# Patient Record
Sex: Female | Born: 1950 | Race: White | Hispanic: No | Smoking: Never smoker
Health system: Southern US, Community
[De-identification: ages and names within clinical notes are randomized; demographics above are authoritative.]

## PROBLEM LIST (undated history)

## (undated) DIAGNOSIS — L309 Dermatitis, unspecified: Secondary | ICD-10-CM

## (undated) DIAGNOSIS — R011 Cardiac murmur, unspecified: Secondary | ICD-10-CM

## (undated) DIAGNOSIS — E785 Hyperlipidemia, unspecified: Secondary | ICD-10-CM

## (undated) DIAGNOSIS — M722 Plantar fascial fibromatosis: Secondary | ICD-10-CM

## (undated) DIAGNOSIS — I10 Essential (primary) hypertension: Secondary | ICD-10-CM

## (undated) HISTORY — PX: COLONOSCOPY: SHX174

## (undated) HISTORY — PX: ANAL FISSURECTOMY: SUR608

## (undated) HISTORY — PX: KNEE ARTHROSCOPY: SUR90

## (undated) HISTORY — PX: ANAL SPHINCTEROTOMY: SHX1140

---

## 2004-10-15 ENCOUNTER — Emergency Department: Payer: Self-pay | Admitting: Emergency Medicine

## 2004-12-12 ENCOUNTER — Ambulatory Visit: Payer: Self-pay | Admitting: Unknown Physician Specialty

## 2007-01-28 ENCOUNTER — Ambulatory Visit: Payer: Self-pay | Admitting: Unknown Physician Specialty

## 2007-05-30 ENCOUNTER — Ambulatory Visit: Payer: Self-pay | Admitting: Gastroenterology

## 2008-04-19 ENCOUNTER — Ambulatory Visit: Payer: Self-pay | Admitting: Unknown Physician Specialty

## 2009-05-29 ENCOUNTER — Ambulatory Visit: Payer: Self-pay | Admitting: Unknown Physician Specialty

## 2011-01-14 ENCOUNTER — Ambulatory Visit: Payer: Self-pay | Admitting: Unknown Physician Specialty

## 2012-02-03 IMAGING — MG MAM DGTL SCREENING MAMMO W/CAD
1 series · 7 of 7 positions shown · non-contrast
Comparison: none

REASON FOR EXAM: SCREENING
COMMENTS:  Submitted by practice: [facility] [HOSPITAL] Scheduled by
user: Menze

PROCEDURE:     MAM - MAM DGTL SCREENING MAMMO W/CAD  - January 14, 2011  [DATE]
RESULT:     Comparison is made to prior studies dated 01-28-07, 05-29-09, and
04-19-08.
The breasts are primarily fatty involuted. There is mild heterogeneity.
There is no radiographic evidence to suggest malignancy.

[R CC · right · 7 of 7 slices shown]
[im 1/7]
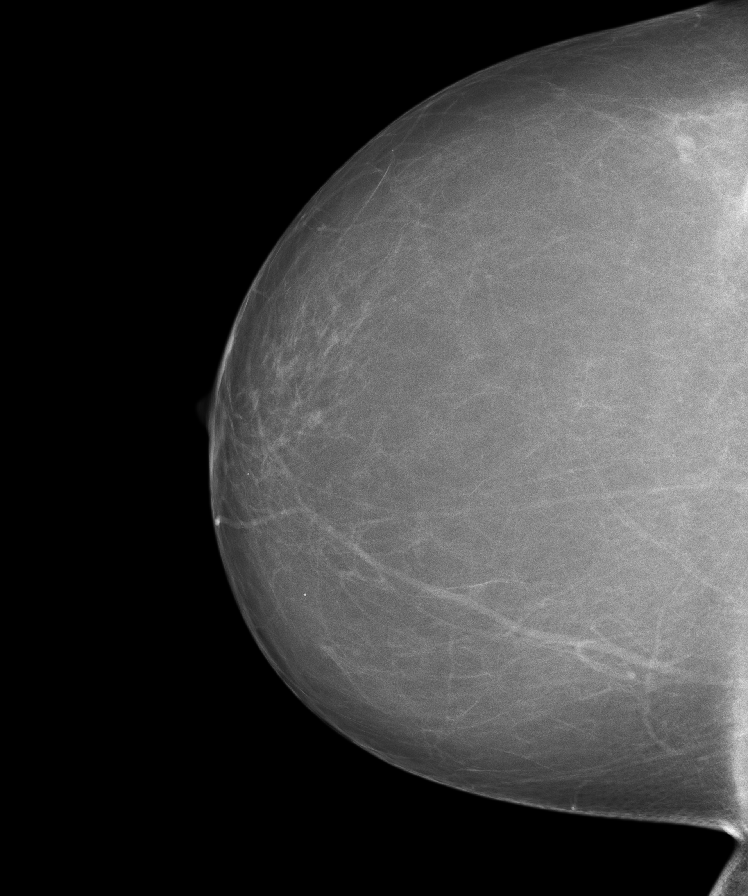
[im 2/7]
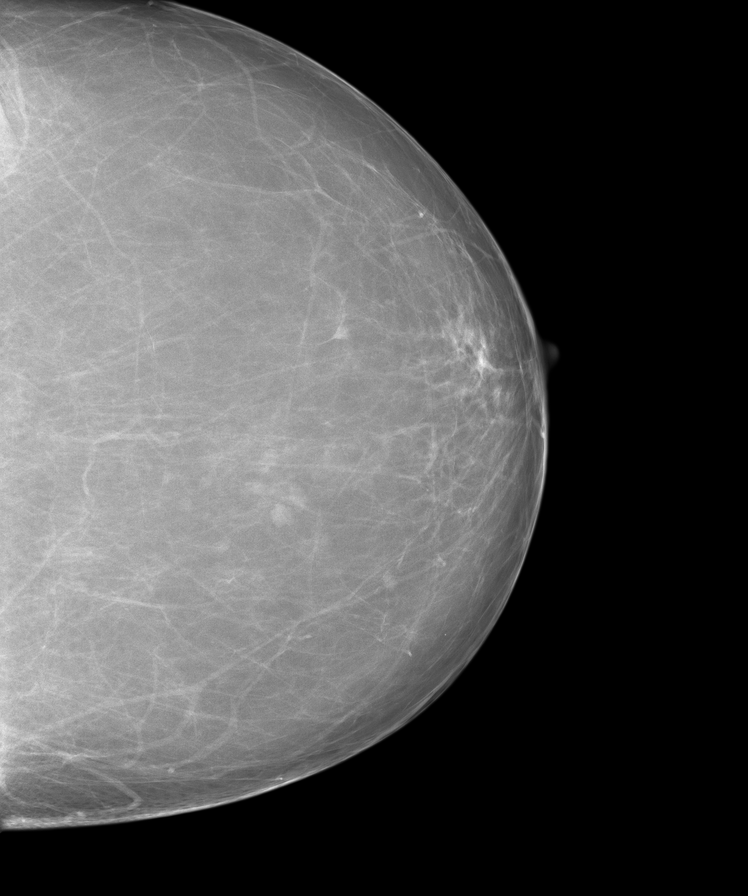
[im 3/7]
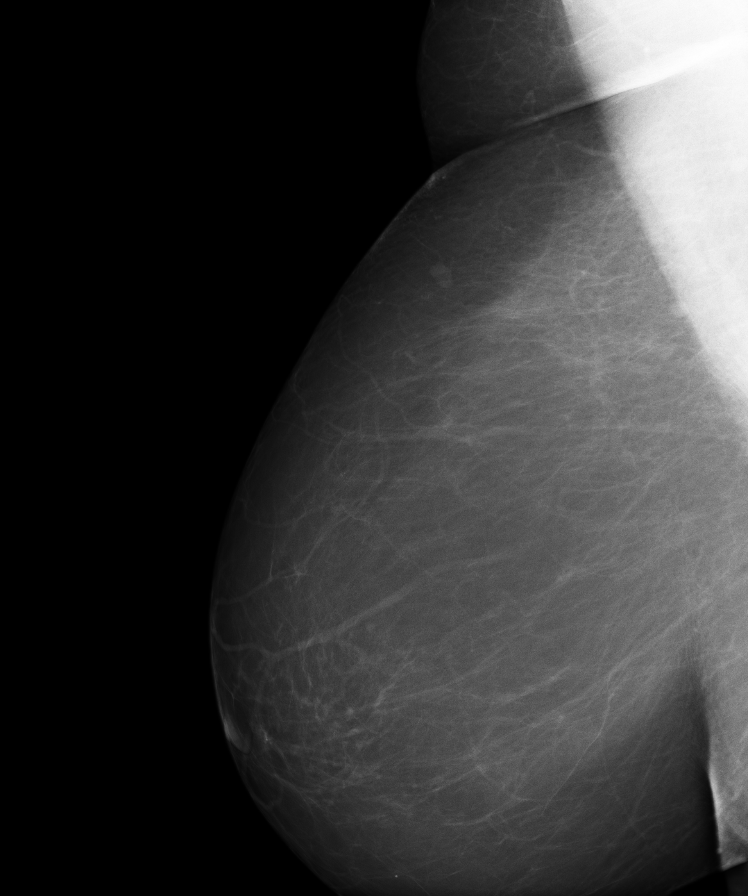
[im 4/7]
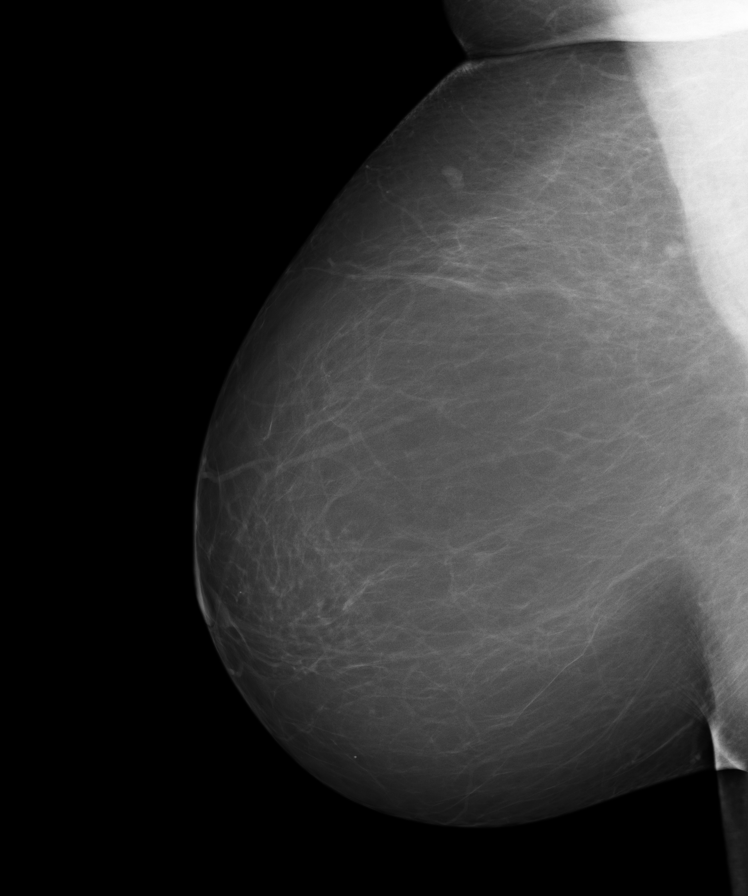
[im 5/7]
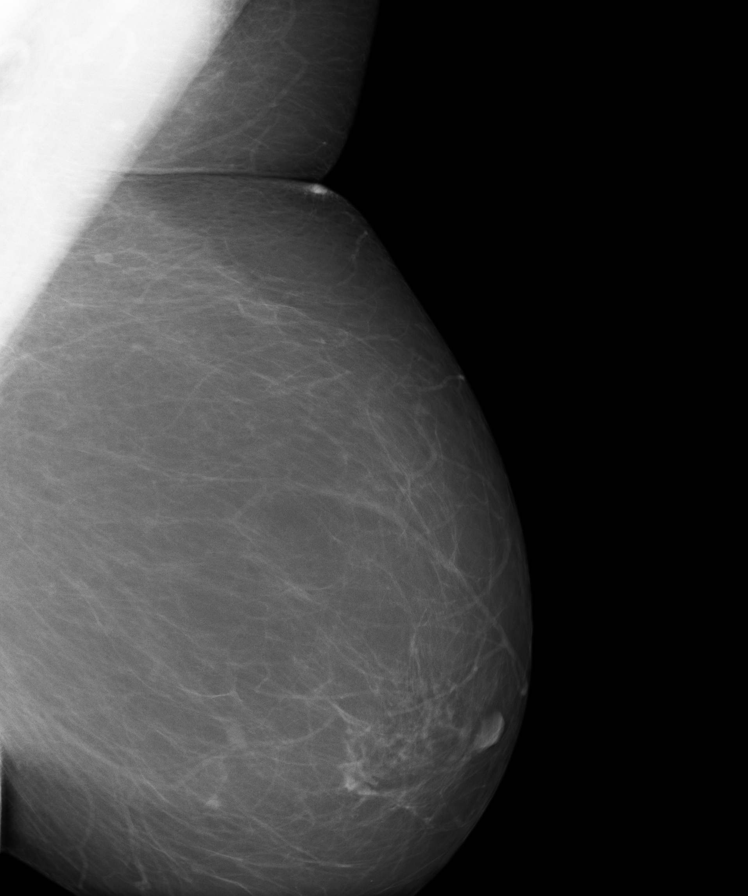
[im 6/7]
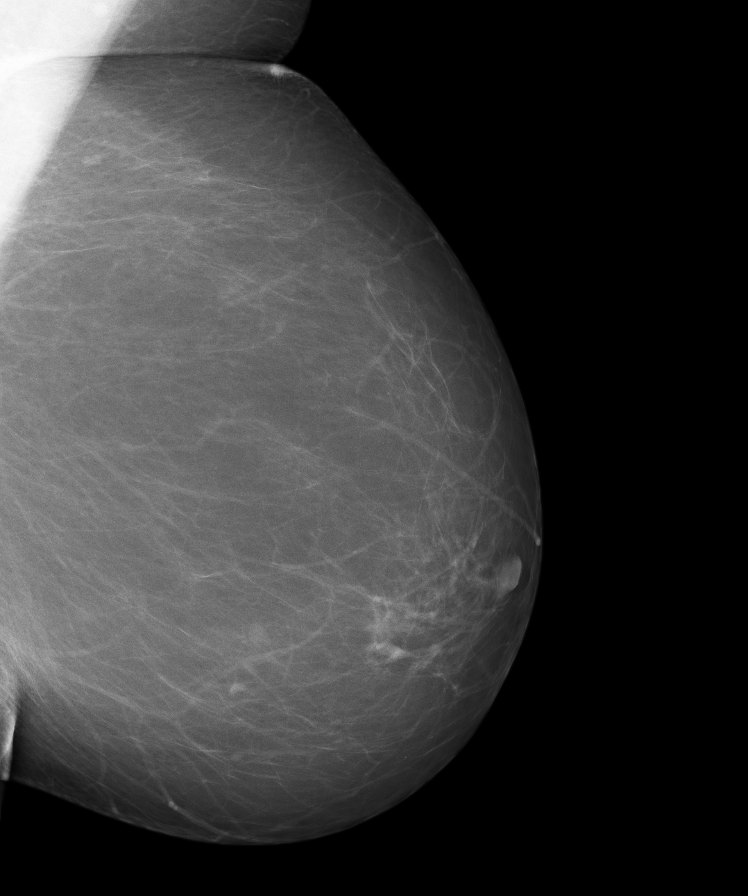
[im 7/7]
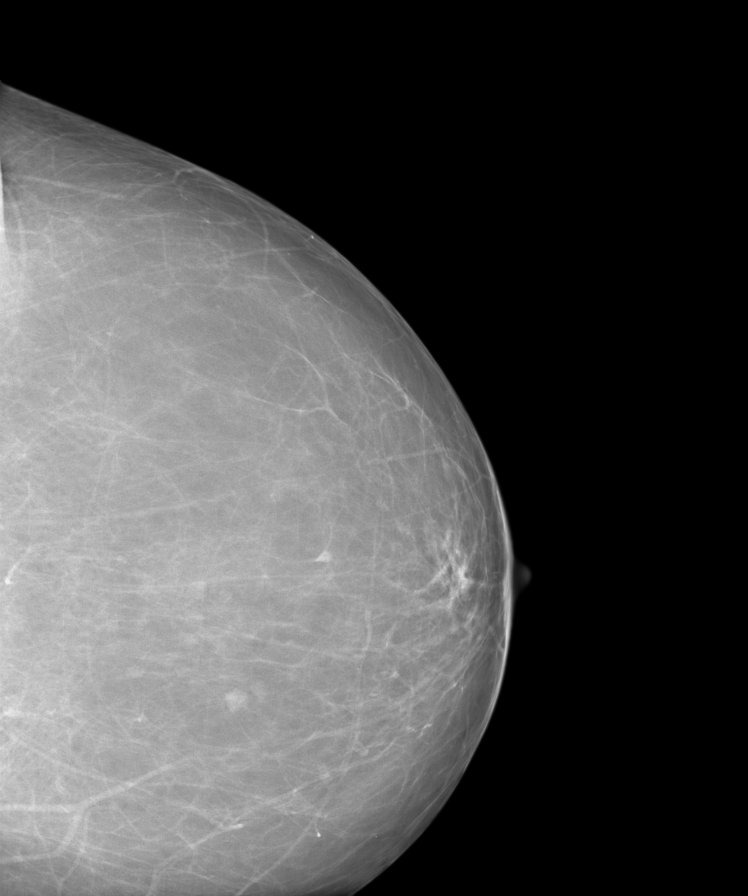

[7 of 7 positions shown; findings below may reference images not displayed]

IMPRESSION: BI-RADS: Category 2 - Benign Findings

A NEGATIVE MAMMOGRAM REPORT DOES NOT PRECLUDE BIOPSY OR OTHER EVALUATION OF
A CLINICALLY PALPABLE OR OTHERWISE SUSPICIOUS MASS OR LESION. BREAST CANCER
MAY NOT BE DETECTED BY MAMMOGRAPHY IN UP TO 10% OF CASES.

## 2012-02-20 ENCOUNTER — Ambulatory Visit: Payer: Self-pay | Admitting: Unknown Physician Specialty

## 2013-03-11 IMAGING — MG MAM DGTL SCREENING MAMMO W/CAD
1 series · 6 of 6 positions shown · non-contrast
Comparison: none

REASON FOR EXAM: screening
COMMENTS:  Submitted by practice: [facility] [[facility]] Scheduled by
user: Fridtjov

[Series 9863: R CC · right · 6 of 6 slices shown]
[im 1/6]
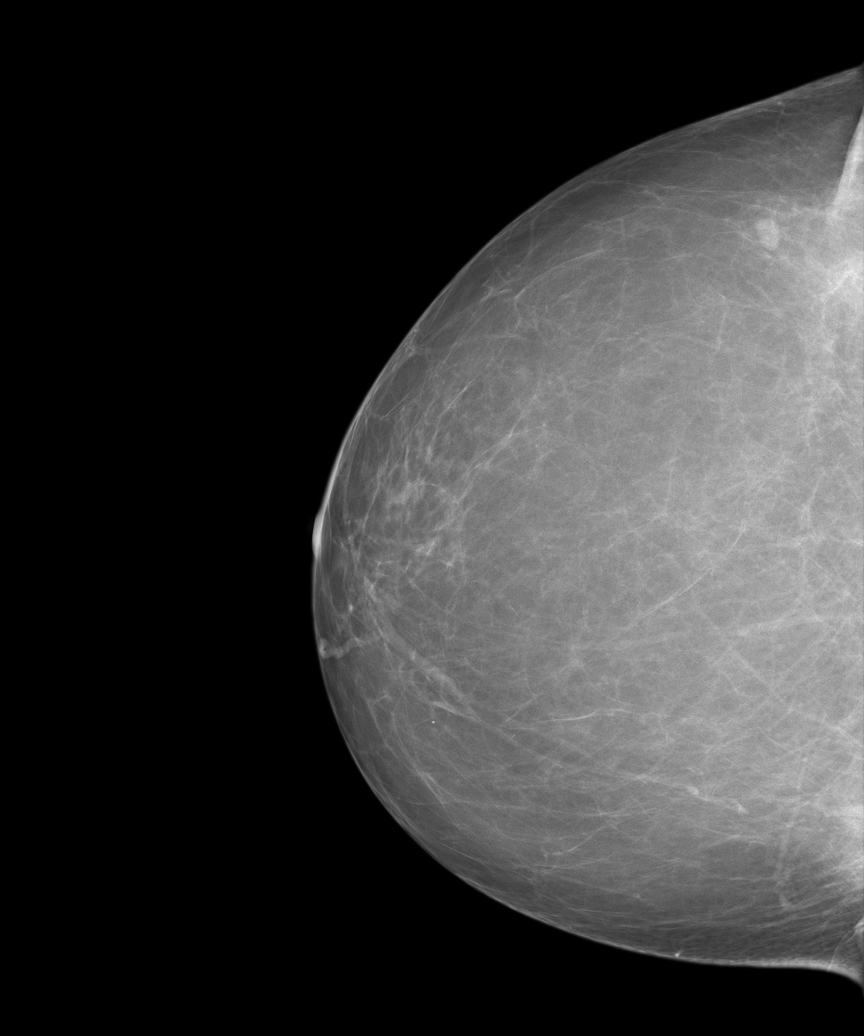
[im 2/6]
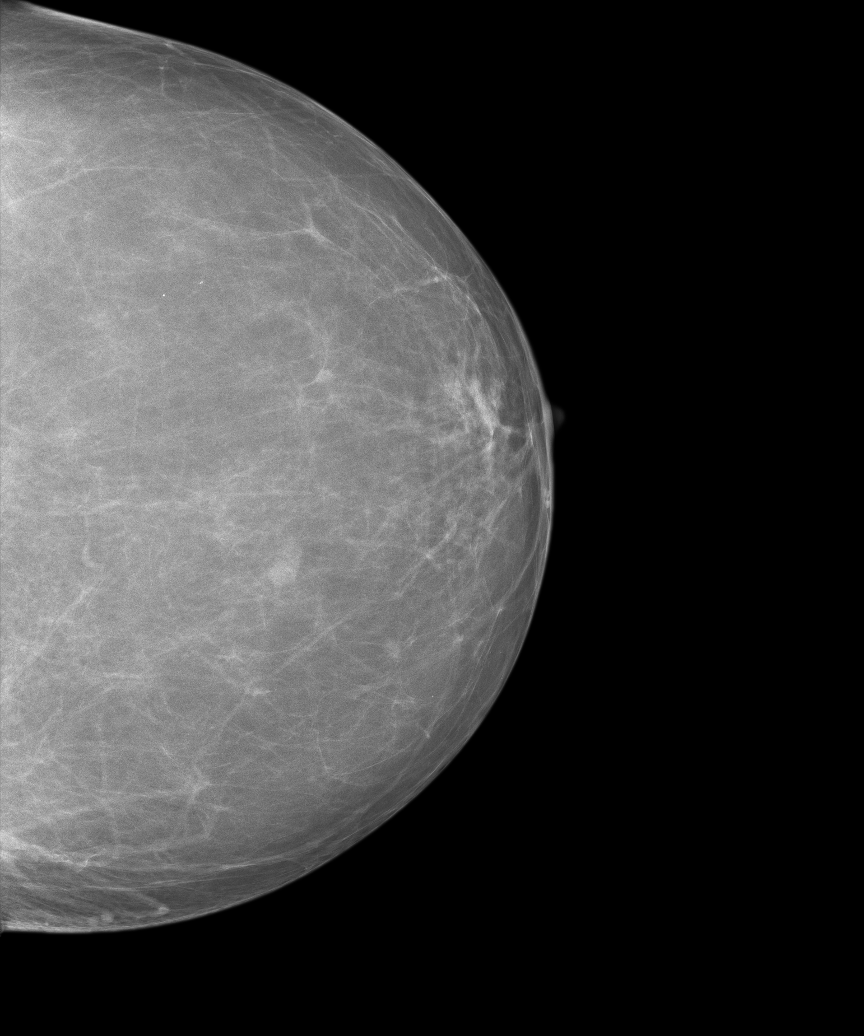
[im 3/6]
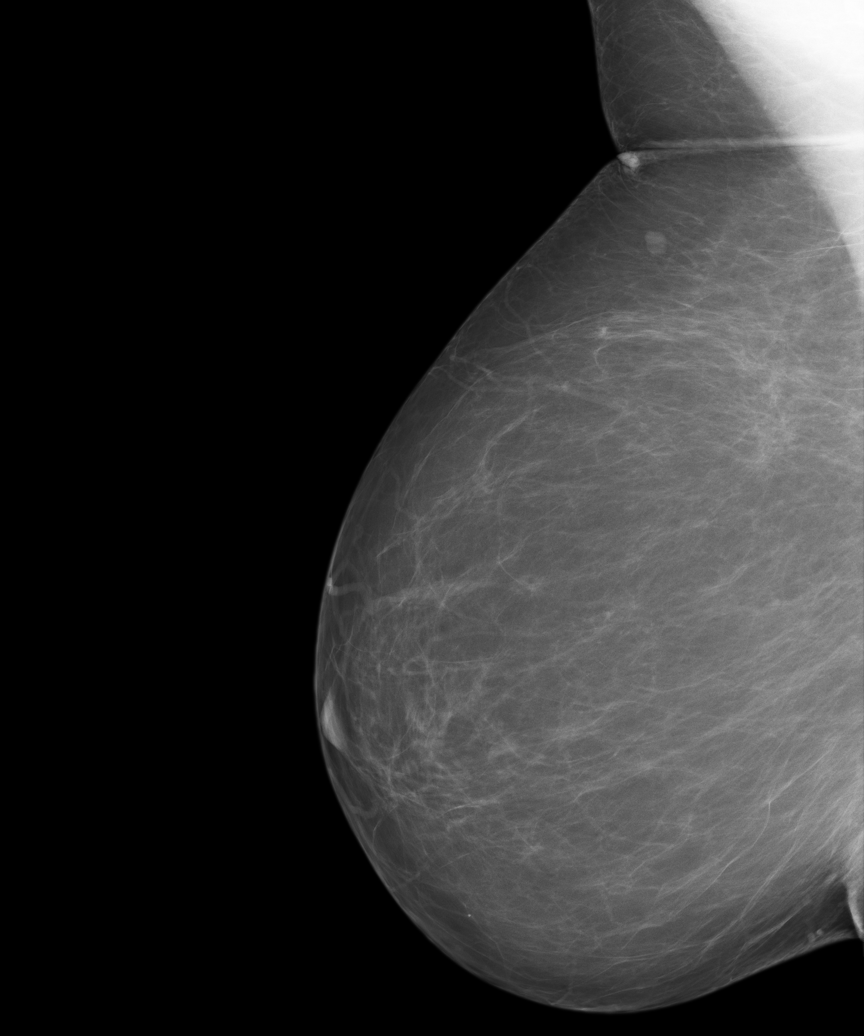
[im 4/6]
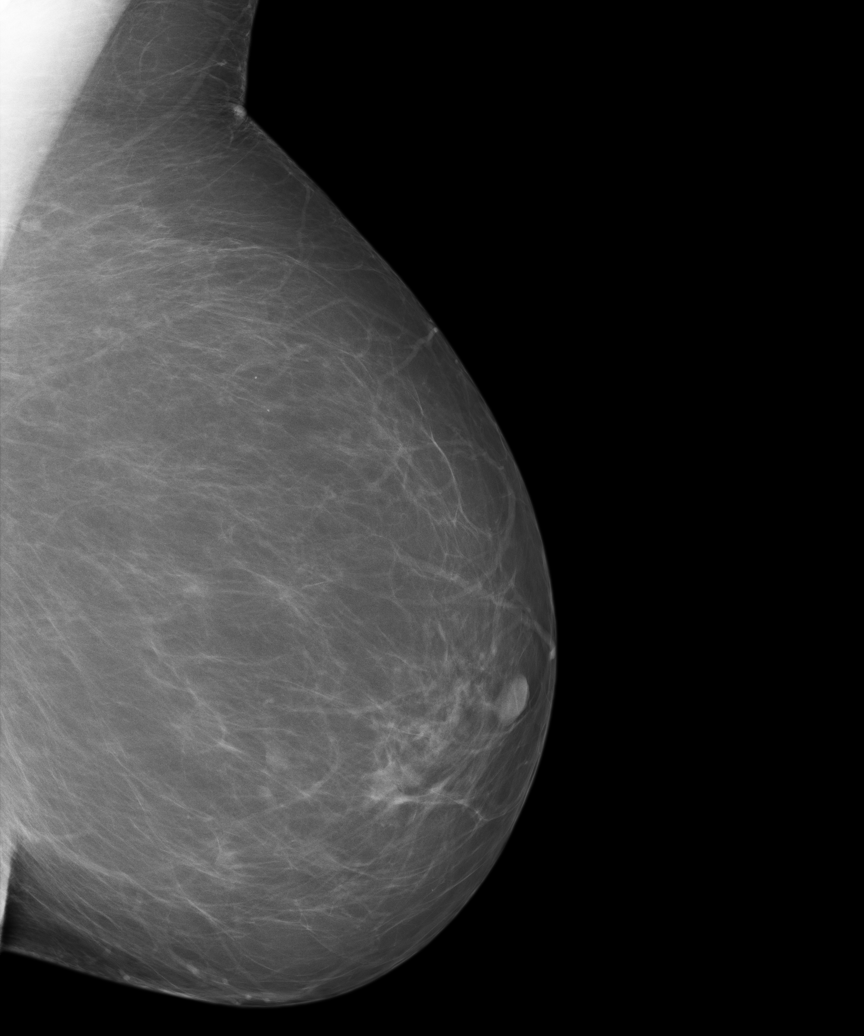
[im 5/6]
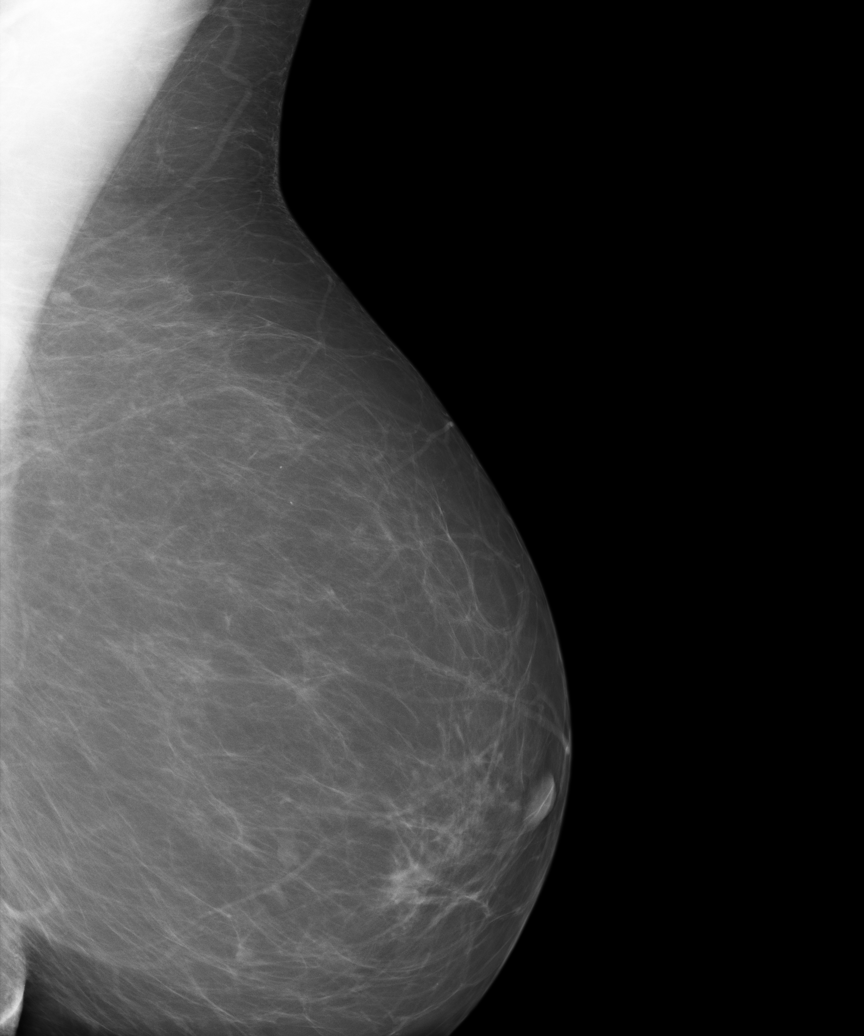
[im 6/6]
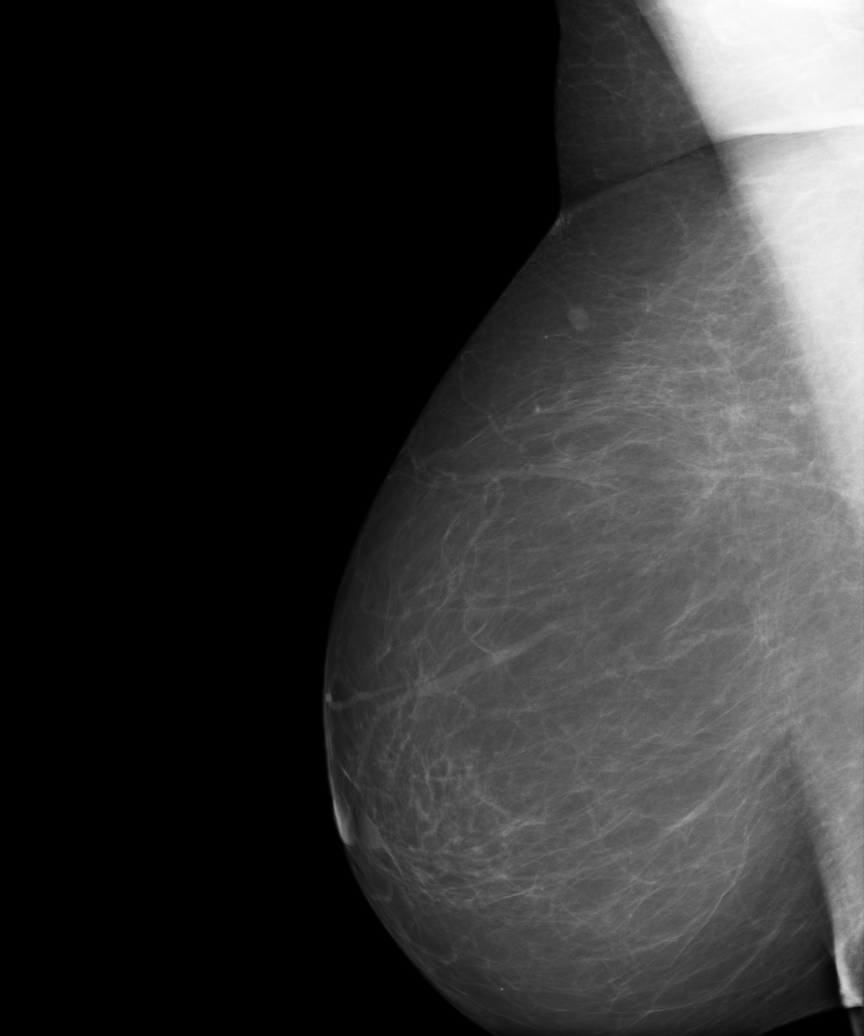

[6 of 6 positions shown; findings below may reference images not displayed]

PROCEDURE:     MAM - MAM DGTL SCREENING MAMMO W/CAD  - February 20, 2012  [DATE]

RESULT:     Comparison is made to a previous digital study 14 January, 2011 as well as 29 May, 2009 and 19 April, 2008.

The breasts exhibit a heterogeneously dense parenchymal pattern with
evidence of ongoing involution. There is no dominant mass. There is
nodularity laterally on the right and medially on the left which appears
stable. I see no malignant appearing grouping of microcalcification and no
area of new architectural distortion.
IMPRESSION: I do not see findings suspicious for malignancy.

BI-RADS 2  benign findings

Recommendation: Please continue to encourage yearly mammographic followup.

A negative mammographic report should not preclude biopsy of clinically
palpable or otherwise suspicious lesions.

## 2013-05-05 ENCOUNTER — Ambulatory Visit: Payer: Self-pay | Admitting: Unknown Physician Specialty

## 2014-05-25 IMAGING — MG MAM DGTL SCREENING MAMMO W/CAD
1 series · 5 of 5 positions shown · non-contrast
Comparison: none

REASON FOR EXAM: screening
COMMENTS:  Submitted by practice: [facility] [HOSPITAL] Scheduled by
user: German Augusto

[R CC · right · 5 of 5 slices shown]
[im 1/5]
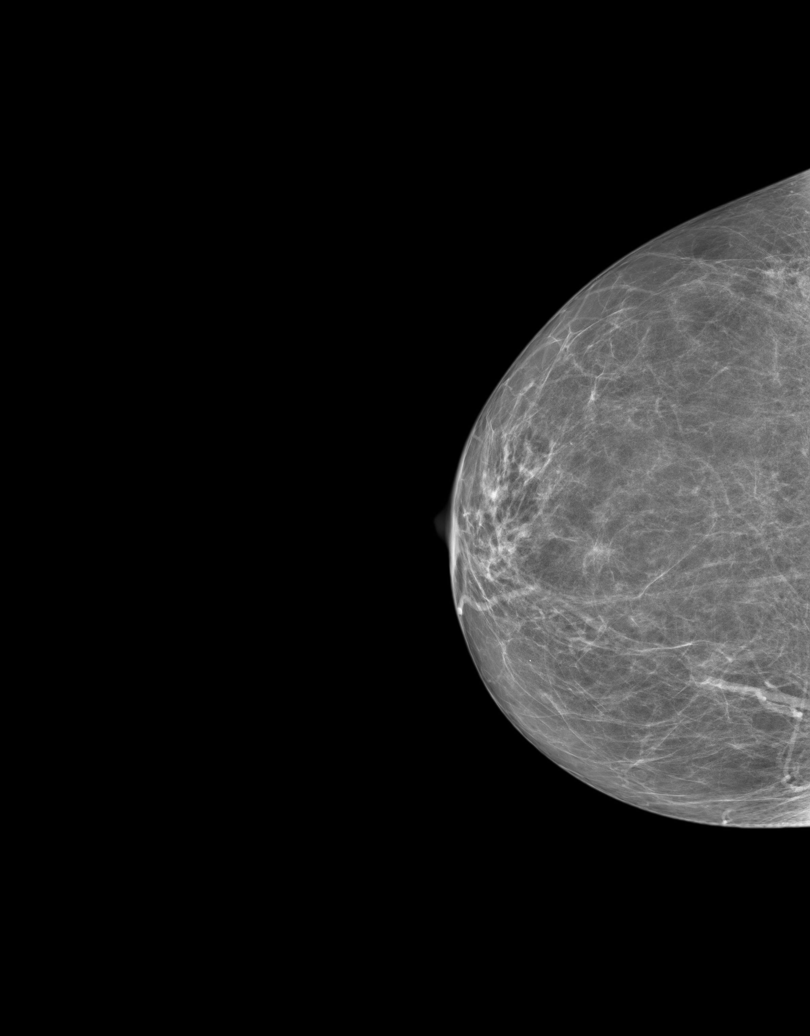
[im 2/5]
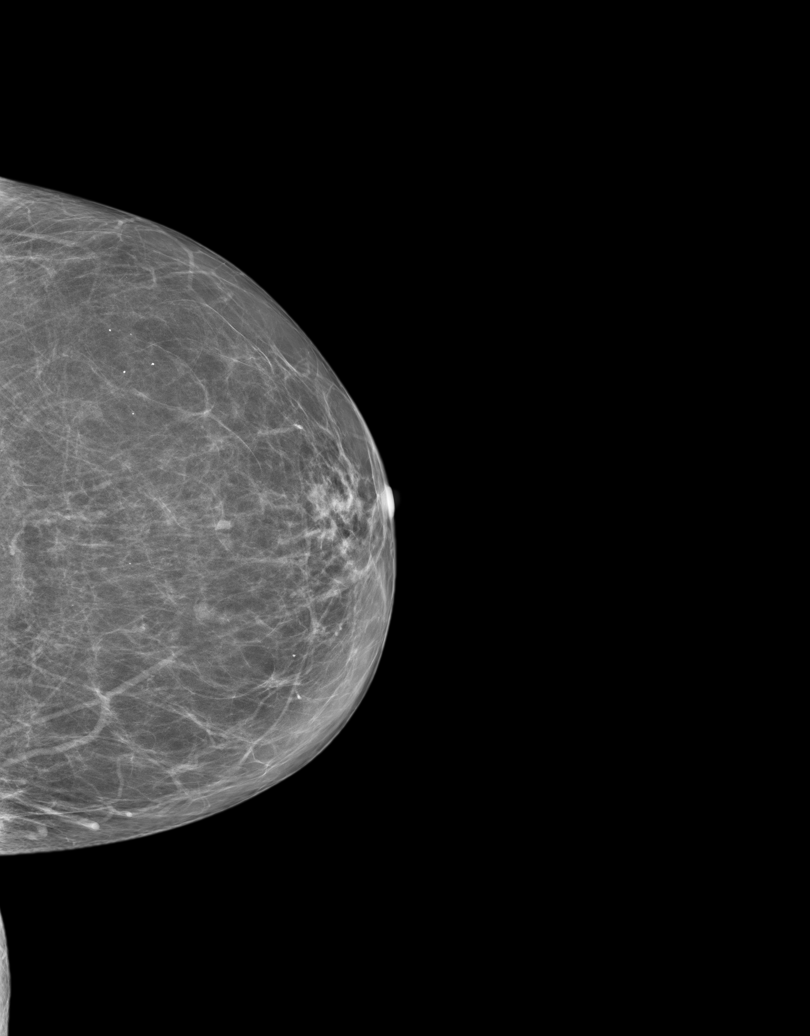
[im 3/5]
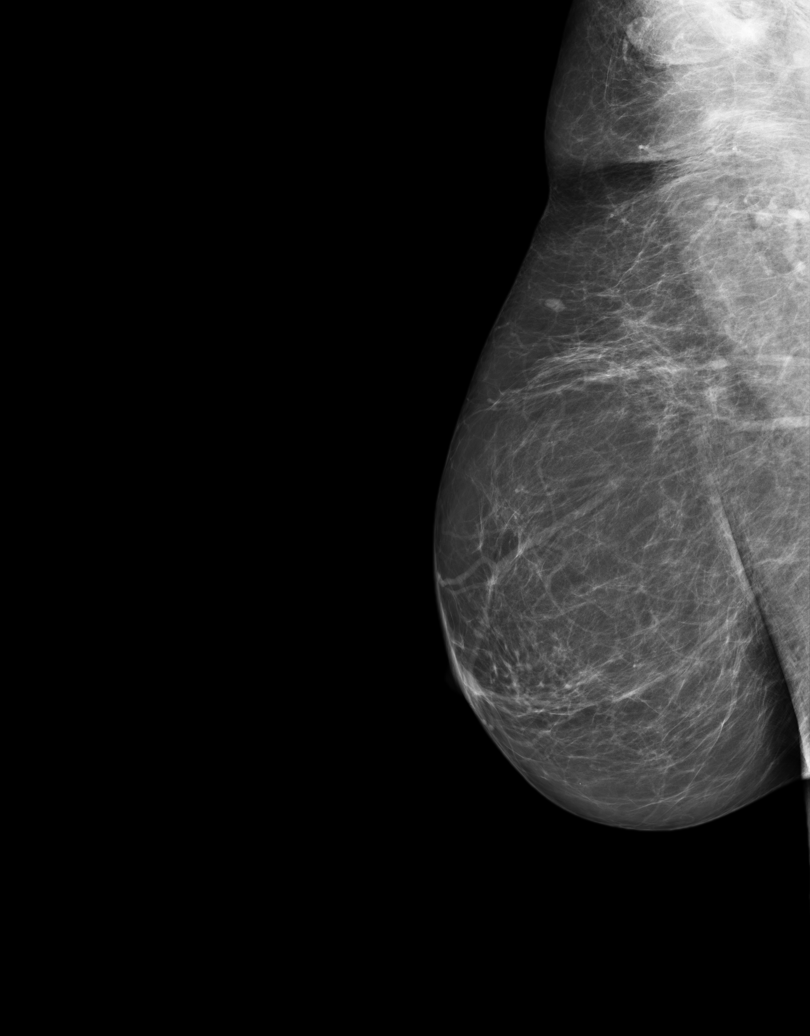
[im 4/5]
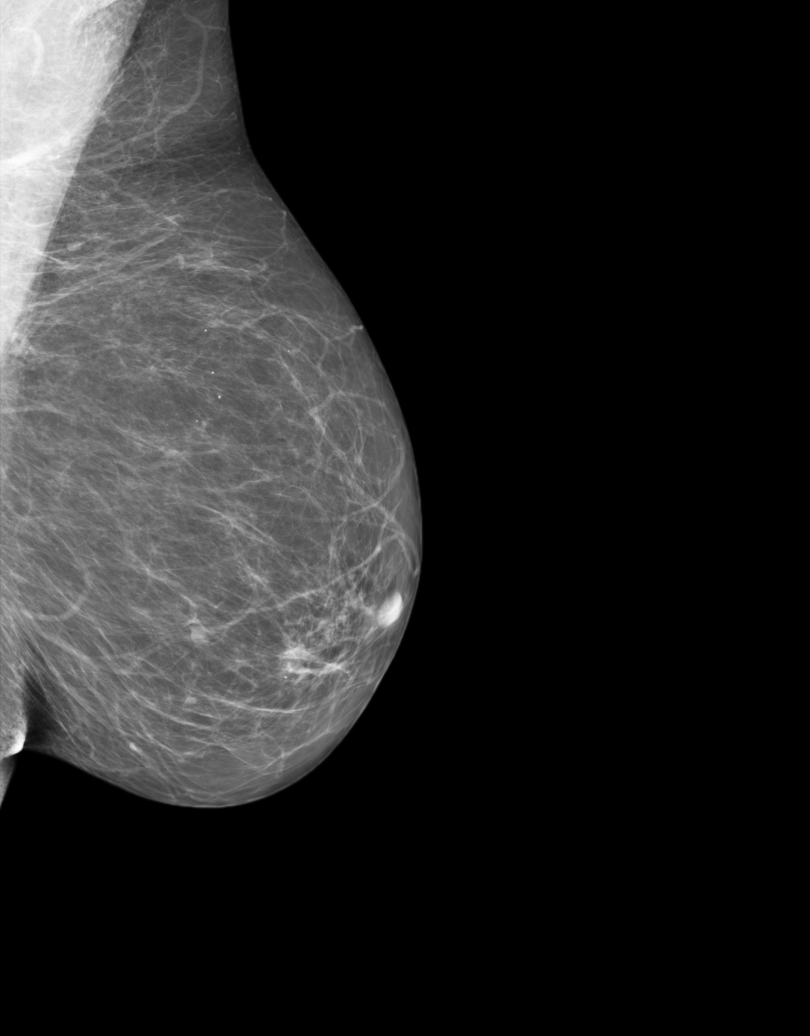
[im 5/5]
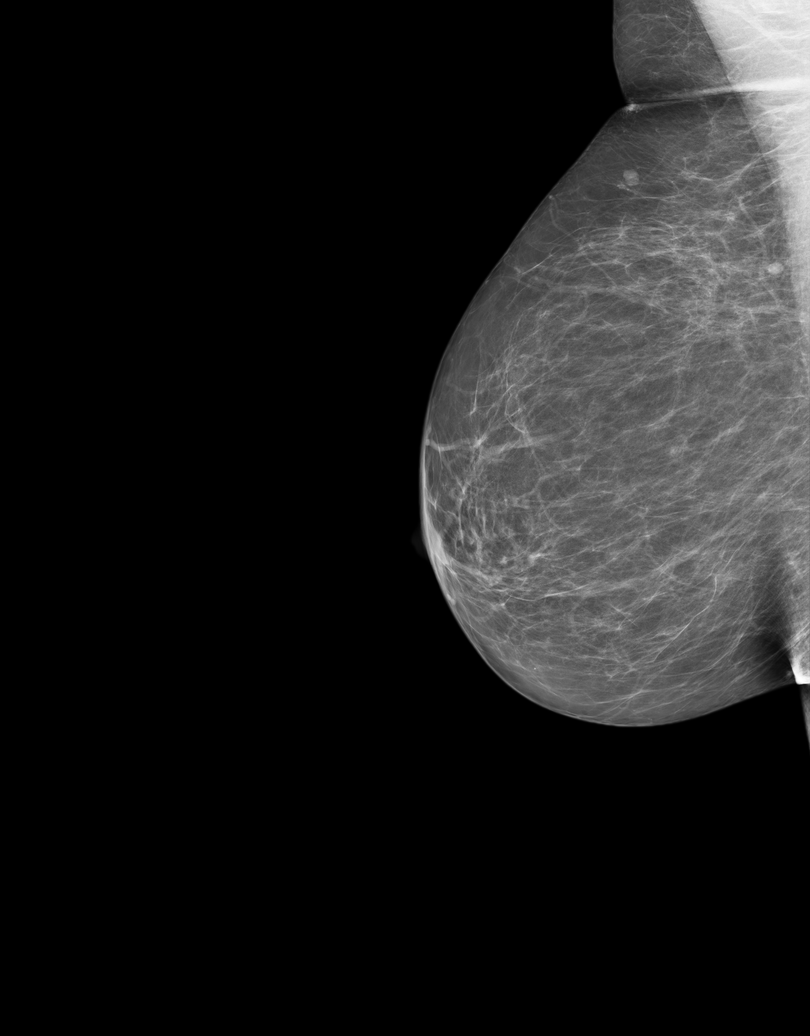

[5 of 5 positions shown; findings below may reference images not displayed]

PROCEDURE:     MAM - MAM DGTL SCREENING MAMMO W/CAD  - May 05, 2013  [DATE]

RESULT:     Comparison is made to previous digital studies May 19, 2012,January 14, 2011, and April 19, 2008.

The breasts exhibit a scattered fibroglandular pattern with evidence of
ongoing involution. There is no dominant mass. There are benign-appearing
lymph nodes in the axillary regions. There are no malignant appearing
groupings of microcalcification. There are coarse microcalcifications on the
left which are stable.
IMPRESSION: There are no findings suspicious for malignancy.

BI-RADS 2: Benign findings.

Recommendation: please continue to encourage yearly mammographic followup.

BREAST COMPOSITION: The breast composition is SCATTERED FIBROGLANDULAR
TISSUE (glandular tissue is 25-50%)

A NEGATIVE MAMMOGRAM REPORT DOES NOT PRECLUDE BIOPSY OR OTHER EVALUATION OF
A CLINICALLY PALPABLE OR OTHERWISE SUSPICIOUS MASS OR LESION. BREAST CANCER
MAY NOT BE DETECTED BY MAMMOGRAPHY IN UP TO 10% OF CASES.

Dictation site:1

## 2017-09-04 DIAGNOSIS — L03011 Cellulitis of right finger: Secondary | ICD-10-CM | POA: Diagnosis not present

## 2019-11-24 ENCOUNTER — Other Ambulatory Visit: Payer: Self-pay | Admitting: Internal Medicine

## 2019-11-24 DIAGNOSIS — Z1231 Encounter for screening mammogram for malignant neoplasm of breast: Secondary | ICD-10-CM

## 2020-01-26 ENCOUNTER — Other Ambulatory Visit: Payer: Self-pay | Admitting: Internal Medicine

## 2020-01-26 DIAGNOSIS — Z1159 Encounter for screening for other viral diseases: Secondary | ICD-10-CM | POA: Diagnosis not present

## 2020-01-26 DIAGNOSIS — Z1211 Encounter for screening for malignant neoplasm of colon: Secondary | ICD-10-CM | POA: Diagnosis not present

## 2020-01-26 DIAGNOSIS — M8580 Other specified disorders of bone density and structure, unspecified site: Secondary | ICD-10-CM | POA: Diagnosis not present

## 2020-01-26 DIAGNOSIS — Z1231 Encounter for screening mammogram for malignant neoplasm of breast: Secondary | ICD-10-CM

## 2020-01-26 DIAGNOSIS — E782 Mixed hyperlipidemia: Secondary | ICD-10-CM | POA: Diagnosis not present

## 2020-01-26 DIAGNOSIS — Z0000 Encounter for general adult medical examination without abnormal findings: Secondary | ICD-10-CM | POA: Diagnosis not present

## 2020-01-26 DIAGNOSIS — I10 Essential (primary) hypertension: Secondary | ICD-10-CM | POA: Diagnosis not present

## 2020-01-26 DIAGNOSIS — Z79899 Other long term (current) drug therapy: Secondary | ICD-10-CM | POA: Diagnosis not present

## 2020-01-31 DIAGNOSIS — M8588 Other specified disorders of bone density and structure, other site: Secondary | ICD-10-CM | POA: Diagnosis not present

## 2020-03-17 DIAGNOSIS — Z1211 Encounter for screening for malignant neoplasm of colon: Secondary | ICD-10-CM | POA: Diagnosis not present

## 2020-03-17 DIAGNOSIS — Z9889 Other specified postprocedural states: Secondary | ICD-10-CM | POA: Diagnosis not present

## 2020-03-17 DIAGNOSIS — Z8719 Personal history of other diseases of the digestive system: Secondary | ICD-10-CM | POA: Diagnosis not present

## 2020-04-04 DIAGNOSIS — Z0000 Encounter for general adult medical examination without abnormal findings: Secondary | ICD-10-CM | POA: Diagnosis not present

## 2020-04-04 DIAGNOSIS — I10 Essential (primary) hypertension: Secondary | ICD-10-CM | POA: Diagnosis not present

## 2020-04-04 DIAGNOSIS — E782 Mixed hyperlipidemia: Secondary | ICD-10-CM | POA: Diagnosis not present

## 2020-04-04 DIAGNOSIS — Z23 Encounter for immunization: Secondary | ICD-10-CM | POA: Diagnosis not present

## 2020-05-24 ENCOUNTER — Other Ambulatory Visit: Payer: Self-pay

## 2020-05-24 ENCOUNTER — Other Ambulatory Visit
Admission: RE | Admit: 2020-05-24 | Discharge: 2020-05-24 | Disposition: A | Discharge status: Home / Self Care | Payer: PPO | Source: Ambulatory Visit | Attending: General Surgery | Admitting: General Surgery

## 2020-05-24 DIAGNOSIS — Z20822 Contact with and (suspected) exposure to covid-19: Secondary | ICD-10-CM | POA: Insufficient documentation

## 2020-05-24 DIAGNOSIS — Z01812 Encounter for preprocedural laboratory examination: Secondary | ICD-10-CM | POA: Insufficient documentation

## 2020-05-24 LAB — SARS CORONAVIRUS 2 (TAT 6-24 HRS): SARS Coronavirus 2: NEGATIVE

## 2020-05-25 ENCOUNTER — Encounter: Payer: Self-pay | Admitting: *Deleted

## 2020-05-26 ENCOUNTER — Encounter: Payer: Self-pay | Admitting: General Surgery

## 2020-05-26 ENCOUNTER — Ambulatory Visit: Payer: PPO | Admitting: Certified Registered"

## 2020-05-26 ENCOUNTER — Other Ambulatory Visit: Payer: Self-pay

## 2020-05-26 ENCOUNTER — Ambulatory Visit
Admission: RE | Admit: 2020-05-26 | Discharge: 2020-05-26 | Disposition: A | Discharge status: Home / Self Care | Payer: PPO | Attending: General Surgery | Admitting: General Surgery

## 2020-05-26 ENCOUNTER — Encounter
Admission: RE | Disposition: A | Discharge status: Home / Self Care | Payer: Self-pay | Source: Home / Self Care | Attending: General Surgery

## 2020-05-26 DIAGNOSIS — Z1211 Encounter for screening for malignant neoplasm of colon: Secondary | ICD-10-CM | POA: Diagnosis not present

## 2020-05-26 DIAGNOSIS — Z7982 Long term (current) use of aspirin: Secondary | ICD-10-CM | POA: Diagnosis not present

## 2020-05-26 DIAGNOSIS — E785 Hyperlipidemia, unspecified: Secondary | ICD-10-CM | POA: Diagnosis not present

## 2020-05-26 DIAGNOSIS — Z885 Allergy status to narcotic agent status: Secondary | ICD-10-CM | POA: Insufficient documentation

## 2020-05-26 DIAGNOSIS — K5790 Diverticulosis of intestine, part unspecified, without perforation or abscess without bleeding: Secondary | ICD-10-CM | POA: Diagnosis not present

## 2020-05-26 DIAGNOSIS — K5730 Diverticulosis of large intestine without perforation or abscess without bleeding: Secondary | ICD-10-CM | POA: Insufficient documentation

## 2020-05-26 DIAGNOSIS — Z79899 Other long term (current) drug therapy: Secondary | ICD-10-CM | POA: Insufficient documentation

## 2020-05-26 DIAGNOSIS — I10 Essential (primary) hypertension: Secondary | ICD-10-CM | POA: Diagnosis not present

## 2020-05-26 DIAGNOSIS — Z888 Allergy status to other drugs, medicaments and biological substances status: Secondary | ICD-10-CM | POA: Insufficient documentation

## 2020-05-26 DIAGNOSIS — E119 Type 2 diabetes mellitus without complications: Secondary | ICD-10-CM | POA: Insufficient documentation

## 2020-05-26 HISTORY — DX: Dermatitis, unspecified: L30.9

## 2020-05-26 HISTORY — DX: Plantar fascial fibromatosis: M72.2

## 2020-05-26 HISTORY — PX: COLONOSCOPY WITH PROPOFOL: SHX5780

## 2020-05-26 HISTORY — DX: Hyperlipidemia, unspecified: E78.5

## 2020-05-26 HISTORY — DX: Essential (primary) hypertension: I10

## 2020-05-26 HISTORY — DX: Cardiac murmur, unspecified: R01.1

## 2020-05-26 SURGERY — COLONOSCOPY WITH PROPOFOL
Anesthesia: General

## 2020-05-26 MED ORDER — PROPOFOL 10 MG/ML IV BOLUS
INTRAVENOUS | Status: DC | PRN
  Administered 2020-05-26: 50 mg via INTRAVENOUS

## 2020-05-26 MED ORDER — SODIUM CHLORIDE 0.9 % IV SOLN: INTRAVENOUS | Status: DC

## 2020-05-26 MED ORDER — PROPOFOL 500 MG/50ML IV EMUL
INTRAVENOUS | Status: AC
  Filled 2020-05-26: qty 50

## 2020-05-26 MED ORDER — PROPOFOL 500 MG/50ML IV EMUL
INTRAVENOUS | Status: DC | PRN
  Administered 2020-05-26: 100 ug/kg/min via INTRAVENOUS

## 2020-05-26 NOTE — Transfer of Care (Signed)
Immediate Anesthesia Transfer of Care Note  Patient: Sheila Marquez  Procedure(s) Performed: COLONOSCOPY WITH PROPOFOL (N/A )  Patient Location: PACU and Endoscopy Unit  Anesthesia Type:General  Level of Consciousness: sedated  Airway & Oxygen Therapy: Patient Spontanous Breathing  Post-op Assessment: Report given to RN  Post vital signs: stable  Last Vitals:  Vitals Value Taken Time  BP    Temp    Pulse    Resp    SpO2      Last Pain:  Vitals:   05/26/20 0919  TempSrc: Temporal  PainSc: 0-No pain         Complications: No apparent anesthesia complications

## 2020-05-26 NOTE — Op Note (Signed)
[facility] Patient Name: Sheila Marquez Procedure Date: 05/26/2020 9:41 AM MRN: [id number] Account #: [id number] Date of Birth: 1951-09-20 Admit Type: Outpatient Age: 69 Room: [facility] ENDO ROOM 1 Gender: Female Note Status: Finalized Procedure:             Colonoscopy Indications:           Screening for colorectal malignant neoplasm Providers:             Earline Mayotte, MD Medicines:             Monitored Anesthesia Care Complications:         No immediate complications. Procedure:             Pre-Anesthesia Assessment:                        - Prior to the procedure, a History and Physical was                         performed, and patient medications, allergies and                         sensitivities were reviewed. The patient's tolerance                         of previous anesthesia was reviewed.                        - The risks and benefits of the procedure and the                         sedation options and risks were discussed with the                         patient. All questions were answered and informed                         consent was obtained.                        After obtaining informed consent, the colonoscope was                         passed under direct vision. Throughout the procedure,                         the patient's blood pressure, pulse, and oxygen                         saturations were monitored continuously. The                         Colonoscope was introduced through the anus and                         advanced to the the cecum, identified by appendiceal                         orifice and ileocecal valve. The colonoscopy was  somewhat difficult due to multiple diverticula in the                         colon. The patient tolerated the procedure well. The                         quality of the bowel preparation was excellent. Findings:      Multiple medium-mouthed  diverticula were found in the sigmoid colon and       descending colon.      The retroflexed view of the distal rectum and anal verge was normal and       showed no anal or rectal abnormalities. Impression:            - Diverticulosis in the sigmoid colon and in the                         descending colon.                        - The distal rectum and anal verge are normal on                         retroflexion view.                        - No specimens collected. Recommendation:        - Repeat colonoscopy in 10 years for screening                         purposes. Procedure Code(s):     --- Professional ---                        [phone], Colonoscopy, flexible; diagnostic, including                         collection of specimen(s) by brushing or washing, when                         performed (separate procedure) Diagnosis Code(s):     --- Professional ---                        Z12.11, Encounter for screening for malignant neoplasm                         of colon                        K57.30, Diverticulosis of large intestine without                         perforation or abscess without bleeding CPT copyright 2019 American Medical Association. All rights reserved. The codes documented in this report are preliminary and upon coder review may  be revised to meet current compliance requirements. Robert Bellow, MD 05/26/2020 10:19:07 AM This report has been signed electronically. Number of Addenda: 0 Note Initiated On: 05/26/2020 9:41 AM Scope Withdrawal Time: 0 hours 8 minutes 56 seconds  Total Procedure Duration: 0 hours 21 minutes 0 seconds       [facility]

## 2020-05-26 NOTE — H&P (Signed)
Sheila Marquez [id number] Jan 26, 1951     HPI:  Healthy 69 y.o woman for a screening colon exam. Tolerated prep well.  Reports that after last procedure in the early 2000's she developed an anal fissure.   Prior to prep, no pain or bleeding with BM's.  This AM, noted a small amount of blood.   Medications Prior to Admission  Medication Sig Dispense Refill Last Dose  . amLODipine (NORVASC) 2.5 MG tablet Take 2.5 mg by mouth daily.   05/26/2020 at Unknown time  . aspirin EC 81 MG tablet Take 81 mg by mouth daily.   05/24/2020  . calcium carbonate (OSCAL) 1500 (600 Ca) MG TABS tablet Take 600 mg of elemental calcium by mouth 2 (two) times daily with a meal.   05/22/2020  . cetirizine (ZYRTEC) 10 MG tablet Take 10 mg by mouth daily.   05/24/2020  . Cinnamon 500 MG capsule Take 500 mg by mouth daily.   05/22/2020  . diphenhydrAMINE (BENADRYL) 25 MG tablet Take 25 mg by mouth every 6 (six) hours as needed.   05/24/2020  . glucosamine-chondroitin 500-400 MG tablet Take 1 tablet by mouth 2 (two) times daily.   05/22/2020  . magnesium oxide (MAG-OX) 400 MG tablet Take 400 mg by mouth daily.   05/22/2020  . MEGARED OMEGA-3 KRILL OIL PO Take 750 mg by mouth daily at 2 am.   05/22/2020  . psyllium (METAMUCIL) 58.6 % packet Take 1 packet by mouth 2 (two) times daily.     . vitamin E 1000 UNIT capsule Take 1,000 Units by mouth daily.   05/22/2020  . zinc gluconate 50 MG tablet Take 50 mg by mouth daily.   05/22/2020   Allergies  Allergen Reactions  . Hydrocodone Nausea And Vomiting  . Hydrocodone-Acetaminophen Nausea And Vomiting  . Propoxyphene Nausea And Vomiting  . Statins Other (See Comments)    MUSCLE PAIN   Past Medical History:  Diagnosis Date  . Eczema   . Heart murmur   . Hyperlipidemia   . Hypertension   . Plantar fasciitis    Past Surgical History:  Procedure Laterality Date  . ANAL FISSURECTOMY    . ANAL SPHINCTEROTOMY    . COLONOSCOPY    . KNEE ARTHROSCOPY     Social History    Socioeconomic History  . Marital status: Married    Spouse name: Not on file  . Number of children: Not on file  . Years of education: Not on file  . Highest education level: Not on file  Occupational History  . Not on file  Tobacco Use  . Smoking status: Never Smoker  . Smokeless tobacco: Never Used  Substance and Sexual Activity  . Alcohol use: Yes    Comment: none last 24hrs  . Drug use: Never  . Sexual activity: Not on file  Other Topics Concern  . Not on file  Social History Narrative  . Not on file   Social Determinants of Health   Financial Resource Strain:   . Difficulty of Paying Living Expenses:   Food Insecurity:   . Worried About Programme researcher, broadcasting/film/video in the Last Year:   . Barista in the Last Year:   Transportation Needs:   . Freight forwarder (Medical):   Marland Kitchen Lack of Transportation (Non-Medical):   Physical Activity:   . Days of Exercise per Week:   . Minutes of Exercise per Session:   Stress:   . Feeling of Stress :  Social Connections:   . Frequency of Communication with Friends and Family:   . Frequency of Social Gatherings with Friends and Family:   . Attends Religious Services:   . Active Member of Clubs or Organizations:   . Attends Archivist Meetings:   Marland Kitchen Marital Status:   Intimate Partner Violence:   . Fear of Current or Ex-Partner:   . Emotionally Abused:   Marland Kitchen Physically Abused:   . Sexually Abused:    Social History   Social History Narrative  . Not on file     ROS: Negative.     PE: HEENT: Negative. Lungs: Clear. Cardio: RR.   Assessment/Plan:  Proceed with planned endoscopy.    Forest Gleason [facility] 05/26/2020

## 2020-05-26 NOTE — Anesthesia Postprocedure Evaluation (Signed)
Anesthesia Post Note  Patient: Sheila Marquez  Procedure(s) Performed: COLONOSCOPY WITH PROPOFOL (N/A )  Patient location during evaluation: Endoscopy Anesthesia Type: General Level of consciousness: awake and alert Pain management: pain level controlled Vital Signs Assessment: post-procedure vital signs reviewed and stable Respiratory status: spontaneous breathing, nonlabored ventilation, respiratory function stable and patient connected to nasal cannula oxygen Cardiovascular status: blood pressure returned to baseline and stable Postop Assessment: no apparent nausea or vomiting Anesthetic complications: no     Last Vitals:  Vitals:   05/26/20 1034 05/26/20 1049  BP: 109/69 137/68  Pulse: (!) 52 (!) 57  Resp: 16 14  Temp:    SpO2: 96% 99%    Last Pain:  Vitals:   05/26/20 1049  TempSrc:   PainSc: 0-No pain                 Cleda Mccreedy Jaleeah Slight

## 2020-05-26 NOTE — Anesthesia Preprocedure Evaluation (Signed)
Anesthesia Evaluation  Patient identified by MRN, date of birth, ID band Patient awake    Reviewed: Allergy & Precautions, H&P , NPO status , Patient's Chart, lab work & pertinent test results  Airway Mallampati: III  TM Distance: <3 FB Neck ROM: limited    Dental  (+) Chipped   Pulmonary neg pulmonary ROS, neg shortness of breath,    Pulmonary exam normal        Cardiovascular Exercise Tolerance: Good hypertension, (-) angina(-) DOE Normal cardiovascular exam+ Valvular Problems/Murmurs      Neuro/Psych negative neurological ROS  negative psych ROS   GI/Hepatic negative GI ROS, Neg liver ROS, neg GERD  ,  Endo/Other  diabetes, Type 2  Renal/GU negative Renal ROS  negative genitourinary   Musculoskeletal   Abdominal   Peds  Hematology negative hematology ROS (+)   Anesthesia Other Findings Past Medical History: No date: Eczema No date: Heart murmur No date: Hyperlipidemia No date: Hypertension No date: Plantar fasciitis  Past Surgical History: No date: ANAL FISSURECTOMY No date: ANAL SPHINCTEROTOMY No date: COLONOSCOPY No date: KNEE ARTHROSCOPY  BMI    Body Mass Index: 26.52 kg/m      Reproductive/Obstetrics negative OB ROS                             Anesthesia Physical Anesthesia Plan  ASA: III  Anesthesia Plan: General   Post-op Pain Management:    Induction: Intravenous  PONV Risk Score and Plan: Propofol infusion and TIVA  Airway Management Planned: Natural Airway and Nasal Cannula  Additional Equipment:   Intra-op Plan:   Post-operative Plan:   Informed Consent: I have reviewed the patients History and Physical, chart, labs and discussed the procedure including the risks, benefits and alternatives for the proposed anesthesia with the patient or authorized representative who has indicated his/her understanding and acceptance.     Dental Advisory  Given  Plan Discussed with: Anesthesiologist, CRNA and Surgeon  Anesthesia Plan Comments: (Patient consented for risks of anesthesia including but not limited to:  - adverse reactions to medications - risk of intubation if required - damage to eyes, teeth, lips or other oral mucosa - nerve damage due to positioning  - sore throat or hoarseness - Damage to heart, brain, nerves, lungs, other parts of body or loss of life  Patient voiced understanding.)        Anesthesia Quick Evaluation

## 2020-05-28 NOTE — Progress Notes (Signed)
Pt complaint of rash and stomach discomfort that has resolved. Pt instructed to call Md if any issues. She denies any pain or discomfort at this time.

## 2020-05-29 ENCOUNTER — Encounter: Payer: Self-pay | Admitting: *Deleted

## 2020-08-04 DIAGNOSIS — Z23 Encounter for immunization: Secondary | ICD-10-CM | POA: Diagnosis not present

## 2020-08-04 DIAGNOSIS — I10 Essential (primary) hypertension: Secondary | ICD-10-CM | POA: Diagnosis not present

## 2020-08-04 DIAGNOSIS — E782 Mixed hyperlipidemia: Secondary | ICD-10-CM | POA: Diagnosis not present

## 2020-09-12 ENCOUNTER — Ambulatory Visit
Admission: RE | Admit: 2020-09-12 | Discharge: 2020-09-12 | Disposition: A | Discharge status: Home / Self Care | Payer: PPO | Source: Ambulatory Visit | Attending: Internal Medicine | Admitting: Internal Medicine

## 2020-09-12 ENCOUNTER — Other Ambulatory Visit: Payer: Self-pay

## 2020-09-12 DIAGNOSIS — Z1231 Encounter for screening mammogram for malignant neoplasm of breast: Secondary | ICD-10-CM | POA: Diagnosis not present

## 2020-12-05 DIAGNOSIS — E782 Mixed hyperlipidemia: Secondary | ICD-10-CM | POA: Diagnosis not present

## 2020-12-05 DIAGNOSIS — Z780 Asymptomatic menopausal state: Secondary | ICD-10-CM | POA: Diagnosis not present

## 2020-12-05 DIAGNOSIS — I10 Essential (primary) hypertension: Secondary | ICD-10-CM | POA: Diagnosis not present

## 2020-12-05 DIAGNOSIS — Z23 Encounter for immunization: Secondary | ICD-10-CM | POA: Diagnosis not present

## 2021-01-29 DIAGNOSIS — R399 Unspecified symptoms and signs involving the genitourinary system: Secondary | ICD-10-CM | POA: Diagnosis not present

## 2021-04-05 DIAGNOSIS — I10 Essential (primary) hypertension: Secondary | ICD-10-CM | POA: Diagnosis not present

## 2021-04-05 DIAGNOSIS — Z780 Asymptomatic menopausal state: Secondary | ICD-10-CM | POA: Diagnosis not present

## 2021-04-05 DIAGNOSIS — E782 Mixed hyperlipidemia: Secondary | ICD-10-CM | POA: Diagnosis not present

## 2021-04-12 DIAGNOSIS — I10 Essential (primary) hypertension: Secondary | ICD-10-CM | POA: Diagnosis not present

## 2021-04-12 DIAGNOSIS — E782 Mixed hyperlipidemia: Secondary | ICD-10-CM | POA: Diagnosis not present

## 2021-04-12 DIAGNOSIS — M8580 Other specified disorders of bone density and structure, unspecified site: Secondary | ICD-10-CM | POA: Diagnosis not present

## 2021-04-12 DIAGNOSIS — Z0000 Encounter for general adult medical examination without abnormal findings: Secondary | ICD-10-CM | POA: Diagnosis not present

## 2021-10-15 DIAGNOSIS — Z79899 Other long term (current) drug therapy: Secondary | ICD-10-CM | POA: Diagnosis not present

## 2021-10-15 DIAGNOSIS — E782 Mixed hyperlipidemia: Secondary | ICD-10-CM | POA: Diagnosis not present

## 2021-10-15 DIAGNOSIS — Z1231 Encounter for screening mammogram for malignant neoplasm of breast: Secondary | ICD-10-CM | POA: Diagnosis not present

## 2021-10-15 DIAGNOSIS — I10 Essential (primary) hypertension: Secondary | ICD-10-CM | POA: Diagnosis not present

## 2021-10-15 DIAGNOSIS — R7303 Prediabetes: Secondary | ICD-10-CM | POA: Diagnosis not present

## 2021-10-15 DIAGNOSIS — Z780 Asymptomatic menopausal state: Secondary | ICD-10-CM | POA: Diagnosis not present

## 2021-10-16 ENCOUNTER — Other Ambulatory Visit: Payer: Self-pay | Admitting: Internal Medicine

## 2021-10-16 DIAGNOSIS — Z1231 Encounter for screening mammogram for malignant neoplasm of breast: Secondary | ICD-10-CM

## 2022-04-16 ENCOUNTER — Other Ambulatory Visit: Payer: Self-pay | Admitting: Internal Medicine

## 2022-04-16 DIAGNOSIS — Z1231 Encounter for screening mammogram for malignant neoplasm of breast: Secondary | ICD-10-CM

## 2022-06-03 ENCOUNTER — Ambulatory Visit
Admission: RE | Admit: 2022-06-03 | Discharge: 2022-06-03 | Disposition: A | Discharge status: Home / Self Care | Payer: PPO | Source: Ambulatory Visit | Attending: Internal Medicine | Admitting: Internal Medicine

## 2022-06-03 DIAGNOSIS — Z1231 Encounter for screening mammogram for malignant neoplasm of breast: Secondary | ICD-10-CM | POA: Insufficient documentation

## 2023-04-11 DIAGNOSIS — R350 Frequency of micturition: Secondary | ICD-10-CM | POA: Diagnosis not present

## 2023-04-11 DIAGNOSIS — I10 Essential (primary) hypertension: Secondary | ICD-10-CM | POA: Diagnosis not present

## 2023-04-11 DIAGNOSIS — E782 Mixed hyperlipidemia: Secondary | ICD-10-CM | POA: Diagnosis not present

## 2023-04-11 DIAGNOSIS — R7303 Prediabetes: Secondary | ICD-10-CM | POA: Diagnosis not present

## 2023-04-18 DIAGNOSIS — R7303 Prediabetes: Secondary | ICD-10-CM | POA: Diagnosis not present

## 2023-04-18 DIAGNOSIS — E782 Mixed hyperlipidemia: Secondary | ICD-10-CM | POA: Diagnosis not present

## 2023-04-18 DIAGNOSIS — Z0000 Encounter for general adult medical examination without abnormal findings: Secondary | ICD-10-CM | POA: Diagnosis not present

## 2023-04-18 DIAGNOSIS — I10 Essential (primary) hypertension: Secondary | ICD-10-CM | POA: Diagnosis not present

## 2023-05-26 ENCOUNTER — Other Ambulatory Visit: Payer: Self-pay | Admitting: Internal Medicine

## 2023-05-26 DIAGNOSIS — Z1231 Encounter for screening mammogram for malignant neoplasm of breast: Secondary | ICD-10-CM

## 2023-06-11 ENCOUNTER — Ambulatory Visit
Admission: RE | Admit: 2023-06-11 | Discharge: 2023-06-11 | Disposition: A | Discharge status: Home / Self Care | Payer: PPO | Source: Ambulatory Visit | Attending: Internal Medicine | Admitting: Internal Medicine

## 2023-06-11 DIAGNOSIS — Z1231 Encounter for screening mammogram for malignant neoplasm of breast: Secondary | ICD-10-CM | POA: Diagnosis not present

## 2023-07-03 DIAGNOSIS — R399 Unspecified symptoms and signs involving the genitourinary system: Secondary | ICD-10-CM | POA: Diagnosis not present

## 2023-07-03 DIAGNOSIS — R8290 Unspecified abnormal findings in urine: Secondary | ICD-10-CM | POA: Diagnosis not present

## 2023-10-20 DIAGNOSIS — Z79899 Other long term (current) drug therapy: Secondary | ICD-10-CM | POA: Diagnosis not present

## 2023-10-20 DIAGNOSIS — R7303 Prediabetes: Secondary | ICD-10-CM | POA: Diagnosis not present

## 2023-10-20 DIAGNOSIS — Z780 Asymptomatic menopausal state: Secondary | ICD-10-CM | POA: Diagnosis not present

## 2023-10-20 DIAGNOSIS — E782 Mixed hyperlipidemia: Secondary | ICD-10-CM | POA: Diagnosis not present

## 2023-10-20 DIAGNOSIS — I10 Essential (primary) hypertension: Secondary | ICD-10-CM | POA: Diagnosis not present

## 2023-12-31 ENCOUNTER — Encounter (HOSPITAL_COMMUNITY): Payer: Self-pay

## 2023-12-31 ENCOUNTER — Inpatient Hospital Stay
Admission: EM | Admit: 2023-12-31 | Discharge: 2023-12-31 | DRG: 322 | Disposition: A | Discharge status: Other Acute Inpatient Hospital | Payer: PPO | Attending: Family Medicine | Admitting: Family Medicine

## 2023-12-31 ENCOUNTER — Other Ambulatory Visit: Payer: Self-pay

## 2023-12-31 ENCOUNTER — Inpatient Hospital Stay (HOSPITAL_COMMUNITY)
Admission: AD | Admit: 2023-12-31 | Discharge: 2024-01-02 | DRG: 280 | Disposition: A | Discharge status: Home / Self Care | Payer: PPO | Source: Other Acute Inpatient Hospital | Attending: Cardiology | Admitting: Cardiology

## 2023-12-31 ENCOUNTER — Encounter
Admission: EM | Disposition: A | Discharge status: Other Acute Inpatient Hospital | Payer: Self-pay | Source: Home / Self Care | Attending: Family Medicine

## 2023-12-31 ENCOUNTER — Emergency Department: Payer: PPO

## 2023-12-31 DIAGNOSIS — I2510 Atherosclerotic heart disease of native coronary artery without angina pectoris: Secondary | ICD-10-CM | POA: Diagnosis not present

## 2023-12-31 DIAGNOSIS — Z8249 Family history of ischemic heart disease and other diseases of the circulatory system: Secondary | ICD-10-CM

## 2023-12-31 DIAGNOSIS — R0789 Other chest pain: Secondary | ICD-10-CM | POA: Diagnosis not present

## 2023-12-31 DIAGNOSIS — Z79899 Other long term (current) drug therapy: Secondary | ICD-10-CM

## 2023-12-31 DIAGNOSIS — Z888 Allergy status to other drugs, medicaments and biological substances status: Secondary | ICD-10-CM | POA: Diagnosis not present

## 2023-12-31 DIAGNOSIS — Z7982 Long term (current) use of aspirin: Secondary | ICD-10-CM | POA: Diagnosis not present

## 2023-12-31 DIAGNOSIS — Z803 Family history of malignant neoplasm of breast: Secondary | ICD-10-CM | POA: Diagnosis not present

## 2023-12-31 DIAGNOSIS — Z955 Presence of coronary angioplasty implant and graft: Secondary | ICD-10-CM | POA: Diagnosis not present

## 2023-12-31 DIAGNOSIS — E785 Hyperlipidemia, unspecified: Secondary | ICD-10-CM | POA: Diagnosis present

## 2023-12-31 DIAGNOSIS — M722 Plantar fascial fibromatosis: Secondary | ICD-10-CM | POA: Diagnosis present

## 2023-12-31 DIAGNOSIS — T8189XA Other complications of procedures, not elsewhere classified, initial encounter: Secondary | ICD-10-CM | POA: Diagnosis not present

## 2023-12-31 DIAGNOSIS — R079 Chest pain, unspecified: Secondary | ICD-10-CM | POA: Diagnosis not present

## 2023-12-31 DIAGNOSIS — Z885 Allergy status to narcotic agent status: Secondary | ICD-10-CM | POA: Diagnosis not present

## 2023-12-31 DIAGNOSIS — I959 Hypotension, unspecified: Secondary | ICD-10-CM | POA: Diagnosis not present

## 2023-12-31 DIAGNOSIS — I214 Non-ST elevation (NSTEMI) myocardial infarction: Principal | ICD-10-CM | POA: Diagnosis present

## 2023-12-31 DIAGNOSIS — I5021 Acute systolic (congestive) heart failure: Secondary | ICD-10-CM | POA: Diagnosis present

## 2023-12-31 DIAGNOSIS — I10 Essential (primary) hypertension: Secondary | ICD-10-CM | POA: Diagnosis present

## 2023-12-31 DIAGNOSIS — L7632 Postprocedural hematoma of skin and subcutaneous tissue following other procedure: Secondary | ICD-10-CM | POA: Diagnosis present

## 2023-12-31 DIAGNOSIS — I3139 Other pericardial effusion (noninflammatory): Secondary | ICD-10-CM | POA: Diagnosis not present

## 2023-12-31 DIAGNOSIS — R7303 Prediabetes: Secondary | ICD-10-CM | POA: Diagnosis not present

## 2023-12-31 DIAGNOSIS — I110 Hypertensive heart disease with heart failure: Secondary | ICD-10-CM | POA: Diagnosis present

## 2023-12-31 DIAGNOSIS — I213 ST elevation (STEMI) myocardial infarction of unspecified site: Secondary | ICD-10-CM | POA: Diagnosis not present

## 2023-12-31 DIAGNOSIS — R011 Cardiac murmur, unspecified: Secondary | ICD-10-CM | POA: Diagnosis not present

## 2023-12-31 DIAGNOSIS — E782 Mixed hyperlipidemia: Secondary | ICD-10-CM

## 2023-12-31 DIAGNOSIS — I255 Ischemic cardiomyopathy: Secondary | ICD-10-CM | POA: Diagnosis present

## 2023-12-31 DIAGNOSIS — R000 Tachycardia, unspecified: Secondary | ICD-10-CM | POA: Diagnosis present

## 2023-12-31 HISTORY — PX: CORONARY ULTRASOUND/IVUS: CATH118244

## 2023-12-31 HISTORY — PX: LEFT HEART CATH AND CORONARY ANGIOGRAPHY: CATH118249

## 2023-12-31 HISTORY — PX: CORONARY STENT INTERVENTION: CATH118234

## 2023-12-31 LAB — BASIC METABOLIC PANEL
Anion gap: 15 (ref 5–15)
BUN: 12 mg/dL (ref 8–23)
CO2: 19 mmol/L — ABNORMAL LOW (ref 22–32)
Calcium: 10 mg/dL (ref 8.9–10.3)
Chloride: 102 mmol/L (ref 98–111)
Creatinine, Ser: 0.75 mg/dL (ref 0.44–1.00)
GFR, Estimated: >60 mL/min (ref >60)
Glucose, Bld: 134 mg/dL — ABNORMAL HIGH (ref 70–99)
Potassium: 4.2 mmol/L (ref 3.5–5.1)
Sodium: 136 mmol/L (ref 135–145)

## 2023-12-31 LAB — CBC
HCT: 46.9 % — ABNORMAL HIGH (ref 36.0–46.0)
Hemoglobin: 16.4 g/dL — ABNORMAL HIGH (ref 12.0–15.0)
MCH: 32 pg (ref 26.0–34.0)
MCHC: 35 g/dL (ref 30.0–36.0)
MCV: 91.4 fL (ref 80.0–100.0)
Platelets: 245 10*3/uL (ref 150–400)
RBC: 5.13 MIL/uL — ABNORMAL HIGH (ref 3.87–5.11)
RDW: 13 % (ref 11.5–15.5)
WBC: 12.2 10*3/uL — ABNORMAL HIGH (ref 4.0–10.5)
nRBC: 0 % (ref 0.0–0.2)

## 2023-12-31 LAB — TSH: TSH: 0.963 u[IU]/mL (ref 0.350–4.500)

## 2023-12-31 LAB — PROTIME-INR
INR: 1 (ref 0.8–1.2)
Prothrombin Time: 13.7 s (ref 11.4–15.2)

## 2023-12-31 LAB — POCT ACTIVATED CLOTTING TIME
Activated Clotting Time: 245 s
Activated Clotting Time: 245 s
Activated Clotting Time: 279 s

## 2023-12-31 LAB — TROPONIN I (HIGH SENSITIVITY)
Troponin I (High Sensitivity): 7862 ng/L (ref <18)
Troponin I (High Sensitivity): 9158 ng/L (ref <18)

## 2023-12-31 LAB — MRSA NEXT GEN BY PCR, NASAL: MRSA by PCR Next Gen: NOT DETECTED

## 2023-12-31 SURGERY — LEFT HEART CATH AND CORONARY ANGIOGRAPHY
Anesthesia: Moderate Sedation

## 2023-12-31 MED ORDER — HEPARIN (PORCINE) IN NACL 2000-0.9 UNIT/L-% IV SOLN
INTRAVENOUS | Status: DC | PRN
  Administered 2023-12-31: 1000 mL

## 2023-12-31 MED ORDER — HYDRALAZINE HCL 20 MG/ML IJ SOLN: 10.0000 mg | INTRAMUSCULAR | Status: AC | PRN

## 2023-12-31 MED ORDER — ACETAMINOPHEN 325 MG PO TABS
650.0000 mg | ORAL_TABLET | ORAL | Status: DC | PRN
  Administered 2023-12-31 – 2024-01-02 (×6): 650 mg via ORAL
  Filled 2023-12-31 (×6): qty 2

## 2023-12-31 MED ORDER — SODIUM CHLORIDE 0.9% FLUSH: 3.0000 mL | INTRAVENOUS | Status: DC | PRN

## 2023-12-31 MED ORDER — FENTANYL CITRATE (PF) 100 MCG/2ML IJ SOLN
INTRAMUSCULAR | Status: DC | PRN
  Administered 2023-12-31 (×2): 25 ug via INTRAVENOUS

## 2023-12-31 MED ORDER — HEPARIN BOLUS VIA INFUSION
3800.0000 [IU] | Freq: Once | INTRAVENOUS | Status: AC
  Administered 2023-12-31: 3800 [IU] via INTRAVENOUS
  Filled 2023-12-31: qty 3800

## 2023-12-31 MED ORDER — VERAPAMIL HCL 2.5 MG/ML IV SOLN
INTRAVENOUS | Status: DC | PRN
  Administered 2023-12-31 (×2): 2.5 mg via INTRA_ARTERIAL

## 2023-12-31 MED ORDER — SODIUM CHLORIDE 0.9 % IV SOLN
INTRAVENOUS | Status: DC | PRN
  Administered 2023-12-31: 250 mL via INTRAVENOUS

## 2023-12-31 MED ORDER — SODIUM CHLORIDE 0.9 % WEIGHT BASED INFUSION
1.0000 mL/kg/h | INTRAVENOUS | Status: DC
  Administered 2023-12-31: 1 mL/kg/h via INTRAVENOUS

## 2023-12-31 MED ORDER — SODIUM CHLORIDE 0.9% FLUSH
3.0000 mL | Freq: Two times a day (BID) | INTRAVENOUS | Status: DC
  Administered 2024-01-01 – 2024-01-02 (×3): 3 mL via INTRAVENOUS

## 2023-12-31 MED ORDER — NITROGLYCERIN 0.4 MG SL SUBL
0.4000 mg | SUBLINGUAL_TABLET | SUBLINGUAL | Status: DC | PRN
  Administered 2023-12-31: 0.4 mg via SUBLINGUAL
  Filled 2023-12-31: qty 1

## 2023-12-31 MED ORDER — NITROGLYCERIN 1 MG/10 ML FOR IR/CATH LAB
INTRA_ARTERIAL | Status: AC
  Filled 2023-12-31: qty 10

## 2023-12-31 MED ORDER — ONDANSETRON HCL 4 MG/2ML IJ SOLN: 4.0000 mg | Freq: Four times a day (QID) | INTRAMUSCULAR | Status: DC | PRN

## 2023-12-31 MED ORDER — FENTANYL CITRATE (PF) 100 MCG/2ML IJ SOLN
INTRAMUSCULAR | Status: AC
  Filled 2023-12-31: qty 2

## 2023-12-31 MED ORDER — ASPIRIN 81 MG PO TBEC: 81.0000 mg | DELAYED_RELEASE_TABLET | Freq: Every day | ORAL | Status: DC

## 2023-12-31 MED ORDER — MORPHINE SULFATE (PF) 4 MG/ML IV SOLN: 4.0000 mg | INTRAVENOUS | Status: DC | PRN

## 2023-12-31 MED ORDER — TICAGRELOR 90 MG PO TABS
ORAL_TABLET | ORAL | Status: DC | PRN
  Administered 2023-12-31: 180 mg via ORAL

## 2023-12-31 MED ORDER — MIDAZOLAM HCL 2 MG/2ML IJ SOLN
INTRAMUSCULAR | Status: AC
  Filled 2023-12-31: qty 2

## 2023-12-31 MED ORDER — ACETAMINOPHEN 325 MG PO TABS: 650.0000 mg | ORAL_TABLET | ORAL | Status: DC | PRN

## 2023-12-31 MED ORDER — LIDOCAINE HCL 1 % IJ SOLN
INTRAMUSCULAR | Status: AC
Start: 2023-12-31 — End: ?
  Filled 2023-12-31: qty 20

## 2023-12-31 MED ORDER — ONDANSETRON HCL 4 MG/2ML IJ SOLN
4.0000 mg | Freq: Once | INTRAMUSCULAR | Status: AC
  Administered 2023-12-31: 4 mg via INTRAVENOUS
  Filled 2023-12-31: qty 2

## 2023-12-31 MED ORDER — LABETALOL HCL 5 MG/ML IV SOLN: 10.0000 mg | INTRAVENOUS | Status: AC | PRN

## 2023-12-31 MED ORDER — ASPIRIN 81 MG PO CHEW
324.0000 mg | CHEWABLE_TABLET | Freq: Once | ORAL | Status: AC
  Administered 2023-12-31: 324 mg via ORAL
  Filled 2023-12-31: qty 4

## 2023-12-31 MED ORDER — MIDAZOLAM HCL 2 MG/2ML IJ SOLN
INTRAMUSCULAR | Status: DC | PRN
  Administered 2023-12-31: 1 mg via INTRAVENOUS

## 2023-12-31 MED ORDER — ASPIRIN 81 MG PO CHEW: 324.0000 mg | CHEWABLE_TABLET | ORAL | Status: DC

## 2023-12-31 MED ORDER — HEPARIN SODIUM (PORCINE) 1000 UNIT/ML IJ SOLN
INTRAMUSCULAR | Status: AC
  Filled 2023-12-31: qty 10

## 2023-12-31 MED ORDER — HEPARIN (PORCINE) 25000 UT/250ML-% IV SOLN
750.0000 [IU]/h | INTRAVENOUS | Status: DC
  Administered 2023-12-31: 750 [IU]/h via INTRAVENOUS
  Filled 2023-12-31: qty 250

## 2023-12-31 MED ORDER — LIDOCAINE HCL (PF) 1 % IJ SOLN
INTRAMUSCULAR | Status: DC | PRN
  Administered 2023-12-31: 5 mL

## 2023-12-31 MED ORDER — HEPARIN SODIUM (PORCINE) 1000 UNIT/ML IJ SOLN
INTRAMUSCULAR | Status: DC | PRN
  Administered 2023-12-31: 2000 [IU] via INTRAVENOUS
  Administered 2023-12-31 (×2): 3500 [IU] via INTRAVENOUS
  Administered 2023-12-31: 2000 [IU] via INTRAVENOUS

## 2023-12-31 MED ORDER — VERAPAMIL HCL 2.5 MG/ML IV SOLN
INTRAVENOUS | Status: AC
  Filled 2023-12-31: qty 2

## 2023-12-31 MED ORDER — CHLORHEXIDINE GLUCONATE CLOTH 2 % EX PADS
6.0000 | MEDICATED_PAD | Freq: Every day | CUTANEOUS | Status: DC
  Administered 2023-12-31 – 2024-01-02 (×3): 6 via TOPICAL

## 2023-12-31 MED ORDER — ASPIRIN 81 MG PO CHEW
81.0000 mg | CHEWABLE_TABLET | Freq: Every day | ORAL | Status: DC
  Administered 2024-01-01 – 2024-01-02 (×2): 81 mg via ORAL
  Filled 2023-12-31 (×2): qty 1

## 2023-12-31 MED ORDER — IOHEXOL 300 MG/ML  SOLN
INTRAMUSCULAR | Status: DC | PRN
  Administered 2023-12-31: 198 mL

## 2023-12-31 MED ORDER — TICAGRELOR 90 MG PO TABS
ORAL_TABLET | ORAL | Status: AC
  Filled 2023-12-31: qty 2

## 2023-12-31 MED ORDER — SODIUM CHLORIDE 0.9 % IV SOLN: INTRAVENOUS | Status: AC

## 2023-12-31 MED ORDER — NITROGLYCERIN 1 MG/10 ML FOR IR/CATH LAB
INTRA_ARTERIAL | Status: DC | PRN
  Administered 2023-12-31 (×2): 100 ug via INTRACORONARY

## 2023-12-31 MED ORDER — SODIUM CHLORIDE 0.9 % IV SOLN
250.0000 mL | INTRAVENOUS | Status: AC | PRN
  Administered 2024-01-01: 250 mL via INTRAVENOUS

## 2023-12-31 MED ORDER — TICAGRELOR 90 MG PO TABS
90.0000 mg | ORAL_TABLET | Freq: Two times a day (BID) | ORAL | Status: DC
  Administered 2024-01-01 – 2024-01-02 (×3): 90 mg via ORAL
  Filled 2023-12-31 (×3): qty 1

## 2023-12-31 MED ORDER — SODIUM CHLORIDE 0.9 % WEIGHT BASED INFUSION
3.0000 mL/kg/h | INTRAVENOUS | Status: DC
  Administered 2023-12-31: 3 mL/kg/h via INTRAVENOUS

## 2023-12-31 MED ORDER — HEPARIN (PORCINE) IN NACL 1000-0.9 UT/500ML-% IV SOLN
INTRAVENOUS | Status: AC
  Filled 2023-12-31: qty 1000

## 2023-12-31 MED ORDER — ASPIRIN 300 MG RE SUPP: 300.0000 mg | RECTAL | Status: DC

## 2023-12-31 MED ORDER — ASPIRIN 81 MG PO CHEW: 81.0000 mg | CHEWABLE_TABLET | ORAL | Status: DC

## 2023-12-31 MED ORDER — MORPHINE SULFATE (PF) 4 MG/ML IV SOLN
4.0000 mg | Freq: Once | INTRAVENOUS | Status: AC
  Administered 2023-12-31: 4 mg via INTRAVENOUS
  Filled 2023-12-31: qty 1

## 2023-12-31 SURGICAL SUPPLY — 24 items
BALLN EUPHORA RX 3.0X10 (BALLOONS) ×1
BALLN TREK RX 2.25X15 (BALLOONS) ×1
BALLN ~~LOC~~ EUPHORA RX 2.5X15 (BALLOONS) ×1
BALLN ~~LOC~~ TREK NEO RX 3.0X12 (BALLOONS) ×1
BALLOON EUPHORA RX 3.0X10 (BALLOONS) IMPLANT
BALLOON TREK RX 2.25X15 (BALLOONS) IMPLANT
BALLOON ~~LOC~~ EUPHORA RX 2.5X15 (BALLOONS) IMPLANT
BALLOON ~~LOC~~ TREK NEO RX 3.0X12 (BALLOONS) IMPLANT
CATH 5FR JL3.5 JR4 ANG PIG MP (CATHETERS) IMPLANT
CATH EAGLE EYE PLAT IMAGING (CATHETERS) IMPLANT
CATH VISTA GUIDE 6FR XB3 (CATHETERS) ×1
CATH VISTA GUIDE 6FR XB3 MULPK (CATHETERS) IMPLANT
DEVICE RAD TR BAND REGULAR (VASCULAR PRODUCTS) IMPLANT
DRAPE BRACHIAL (DRAPES) IMPLANT
GLIDESHEATH SLEND SS 6F .021 (SHEATH) IMPLANT
GUIDEWIRE INQWIRE 1.5J.035X260 (WIRE) IMPLANT
INQWIRE 1.5J .035X260CM (WIRE) ×1
KIT ENCORE 26 ADVANTAGE (KITS) IMPLANT
PACK CARDIAC CATH (CUSTOM PROCEDURE TRAY) ×1 IMPLANT
PROTECTION STATION PRESSURIZED (MISCELLANEOUS) ×1
SET ATX-X65L (MISCELLANEOUS) IMPLANT
STATION PROTECTION PRESSURIZED (MISCELLANEOUS) IMPLANT
STENT ONYX FRONTIER 2.25X30 (Permanent Stent) IMPLANT
WIRE RUNTHROUGH .014X180CM (WIRE) IMPLANT

## 2023-12-31 NOTE — ED Notes (Signed)
 Dr. Alvester Morin, MD at bedside at this time.

## 2023-12-31 NOTE — Progress Notes (Signed)
 PHARMACY - ANTICOAGULATION CONSULT NOTE  Pharmacy Consult for heparin  Indication: chest pain/ACS  Allergies  Allergen Reactions   Hydrocodone Nausea And Vomiting   Hydrocodone-Acetaminophen  Nausea And Vomiting   Propoxyphene Nausea And Vomiting   Statins Other (See Comments)    MUSCLE PAIN    Patient Measurements: Height: 5' 2 (157.5 cm) Weight: 68 kg (150 lb) IBW/kg (Calculated) : 50.1 Heparin  Dosing Weight: 64.2 kg  Vital Signs: Temp: 98.2 F (36.8 C) (01/08 0617) Temp Source: Oral (01/08 0617) BP: 138/85 (01/08 0617) Pulse Rate: 104 (01/08 0617)  Labs: Recent Labs    12/31/23 0616  HGB 16.4*  HCT 46.9*  PLT 245  CREATININE 0.75  TROPONINIHS 7,862*    Estimated Creatinine Clearance: 56.7 mL/min (by C-G formula based on SCr of 0.75 mg/dL).   Medical History: Past Medical History:  Diagnosis Date   Eczema    Heart murmur    Hyperlipidemia    Hypertension    Plantar fasciitis    Assessment: 73 y/o female presenting with chest pain and elevated troponins. PMH significant for HTN and HLD. Pharmacy has been consulted to initiate heparin . Per chart review, patient is not on anticoagulation PTA. Baseline labs: Hgb 16.4, plt 245, INR ordered.  Goal of Therapy:  Heparin  level 0.3-0.7 units/ml Monitor platelets by anticoagulation protocol: Yes   Plan:  Give 3800 units bolus x 1 Start heparin  infusion at 750 units/hr Check anti-Xa level in 8 hours and daily while on heparin  Continue to monitor H&H and platelets  Thank you for involving pharmacy in this patient's care.   Damien Napoleon, PharmD Clinical Pharmacist 12/31/2023 7:45 AM

## 2023-12-31 NOTE — Assessment & Plan Note (Signed)
 BP stable Titrate home regimen

## 2023-12-31 NOTE — H&P (Signed)
 Cardiology Admission History and Physical   Patient ID: Sheila Marquez MRN: [id number]; DOB: 05-Nov-1951   Admission date: 12/31/2023  PCP:  Sherial Bail, MD   [facility] Providers Cardiologist:  Perla     Chief Complaint:  Chest pain  Patient Profile:   Sheila Marquez is a 73 y.o. female with with history of hypertension and hyperlipidemia with statin intolerance, who is being seen 12/31/2023 for the evaluation of NSTEMI status post PCI to the mid LAD complicated by coronary perforation and small pericardial effusion.  History of Present Illness:   Sheila Marquez presented to the [facility] emergency department this morning, having developed sudden onset of substernal chest pain yesterday evening while preparing dinner.  Pain was associated with shortness of breath, nausea, and diaphoresis.  Pain came and went overnight but returned this morning, prompting her to come to the ED for further evaluation.  Initial EKG showed sinus tachycardia with 1 mm of ST elevation isolated to V2, septal T wave inversions, and anteroseptal Q waves.  Initial troponin was 7862, rising to 9158 on recheck 2 hours later.  She was managed medically with improvement in her chest pain, though she continued to have sporadic vague discomfort.  She was therefore referred for cardiac catheterization and possible PCI.  Cath showed occlusion of the mid LAD.  She underwent successful PCI to the mid LAD with 1 drug-eluting stent, though the procedure was complicated by perforation of a tiny diagonal branch.  This was successfully controlled in the cath lab, with serial echoes done at the bedside demonstrating a stable small pericardial effusion.  At the Zander Ingham of the procedure, the patient was without chest pain or dyspnea.  She remained hemodynamically stable throughout the case.  He has been transferred to [facility] for further monitoring and availability of cardiac surgery were surgical intervention  needed.   Past Medical History:  Diagnosis Date   Eczema    Heart murmur    Hyperlipidemia    Hypertension    Plantar fasciitis     Past Surgical History:  Procedure Laterality Date   ANAL FISSURECTOMY     ANAL SPHINCTEROTOMY     COLONOSCOPY     COLONOSCOPY WITH PROPOFOL  N/A 05/26/2020   Procedure: COLONOSCOPY WITH PROPOFOL ;  Surgeon: Dessa Reyes ORN, MD;  Location: [facility] ENDOSCOPY;  Service: Endoscopy;  Laterality: N/A;   KNEE ARTHROSCOPY       Medications Prior to Admission: Prior to Admission medications   Medication Sig Start Date Jacquelynne Guedes Date Taking? Authorizing Provider  amLODipine (NORVASC) 2.5 MG tablet Take 2.5 mg by mouth daily.    [provider]  aspirin  EC 81 MG tablet Take 81 mg by mouth daily.    [provider]  calcium carbonate (OSCAL) 1500 (600 Ca) MG TABS tablet Take 600 mg of elemental calcium by mouth 2 (two) times daily with a meal.    [provider]  cetirizine (ZYRTEC) 10 MG tablet Take 10 mg by mouth daily.    [provider]  Cinnamon 500 MG capsule Take 500 mg by mouth daily.    [provider]  diphenhydrAMINE (BENADRYL) 25 MG tablet Take 25 mg by mouth every 6 (six) hours as needed.    [provider]  glucosamine-chondroitin 500-400 MG tablet Take 1 tablet by mouth 2 (two) times daily.    [provider]  magnesium oxide (MAG-OX) 400 MG tablet Take 400 mg by mouth daily.    [provider]  MEGARED OMEGA-3 KRILL OIL PO Take 750 mg by mouth daily at 2 am.    [provider]  psyllium (METAMUCIL) 58.6 % packet Take 1 packet by mouth 2 (two) times daily.    [provider]  vitamin E 1000 UNIT capsule Take 1,000 Units by mouth daily.    [provider]  zinc gluconate 50 MG tablet Take 50 mg by mouth daily.    [provider]     Allergies:    Allergies  Allergen Reactions   Hydrocodone Nausea And Vomiting   Hydrocodone-Acetaminophen  Nausea  And Vomiting   Propoxyphene Nausea And Vomiting   Statins Other (See Comments)    MUSCLE PAIN    Social History:   Social History   Socioeconomic History   Marital status: Married    Spouse name: Not on file   Number of children: Not on file   Years of education: Not on file   Highest education level: Not on file  Occupational History   Not on file  Tobacco Use   Smoking status: Never   Smokeless tobacco: Never  Vaping Use   Vaping status: Never Used  Substance and Sexual Activity   Alcohol use: Yes    Comment: none last 24hrs   Drug use: Never   Sexual activity: Not on file  Other Topics Concern   Not on file  Social History Narrative   Not on file   Social Drivers of Health   Financial Resource Strain: Low Risk  (10/20/2023)   Received from [facility]   Overall Financial Resource Strain (CARDIA)    Difficulty of Paying Living Expenses: Not hard at all  Food Insecurity: No Food Insecurity (10/20/2023)   Received from [facility]   Hunger Vital Sign    Worried About Running Out of Food in the Last Year: Never true    Ran Out of Food in the Last Year: Never true  Transportation Needs: No Transportation Needs (10/20/2023)   Received from [facility] - Transportation    In the past 12 months, has lack of transportation kept you from medical appointments or from getting medications?: No    Lack of Transportation (Non-Medical): No  Physical Activity: Not on file  Stress: Not on file  Social Connections: Not on file  Intimate Partner Violence: Not on file    Family History:   The patient's family history includes Breast cancer in her cousin; CAD in her brother.    ROS:  Please see the history of present illness.  All other ROS reviewed and negative.     Physical Exam/Data:  VS: Pulse 93, respirations 18, blood pressure 140/64, oxygen saturation 91% on 2 L.  No intake or output data in the 24 hours  ending 12/31/23 1921    12/31/2023    6:14 AM 05/26/2020    9:19 AM  Last 3 Weights  Weight (lbs) 150 lb 145 lb  Weight (kg) 68.04 kg 65.772 kg     There is no height or weight on file to calculate BMI.  General: Elderly woman lying comfortably in bed. HEENT: normal Neck: no JVD Vascular: No carotid bruits; Distal pulses 2+ bilaterally   Cardiac: Regular rate and rhythm without murmurs, rubs, or gallops. Lungs:  clear to auscultation bilaterally, no wheezing, rhonchi or rales  Abd: soft, nontender, no hepatomegaly  Ext: no lower extremity edema.  TR band noted on the right wrist, apply pressure to  be ulnar cath site. Musculoskeletal:  No deformities, BUE and BLE strength normal and equal Skin: warm and dry  Neuro:  CNs 2-12 intact, no focal abnormalities noted Psych:  Normal affect   EKG:  The ECG that was done at this morning at 7:31 was personally reviewed and demonstrates normal sinus rhythm versus ectopic atrial rhythm, anteroseptal infarct with nonspecific ST abnormalities and septal T wave inversions.  Relevant CV Studies: See catheterization report below  Laboratory Data:  High Sensitivity Troponin:   Recent Labs  Lab 12/31/23 0616 12/31/23 0818  TROPONINIHS 7,862* 9,158*      Chemistry Recent Labs  Lab 12/31/23 0616  NA 136  K 4.2  CL 102  CO2 19*  GLUCOSE 134*  BUN 12  CREATININE 0.75  CALCIUM 10.0  GFRNONAA >60  ANIONGAP 15    No results for input(s): PROT, ALBUMIN, AST, ALT, ALKPHOS, BILITOT in the last 168 hours. Lipids No results for input(s): CHOL, TRIG, HDL, LABVLDL, LDLCALC, CHOLHDL in the last 168 hours. Hematology Recent Labs  Lab 12/31/23 0616  WBC 12.2*  RBC 5.13*  HGB 16.4*  HCT 46.9*  MCV 91.4  MCH 32.0  MCHC 35.0  RDW 13.0  PLT 245   Thyroid   Recent Labs  Lab 12/31/23 0616  TSH 0.963   BNPNo results for input(s): BNP, PROBNP in the last 168 hours.  DDimer No results for input(s): DDIMER in the  last 168 hours.   Radiology/Studies:  CARDIAC CATHETERIZATION Result Date: 12/31/2023 Conclusions: Severe single-vessel coronary artery disease with occlusion of mid LAD.  Minimal luminal irregularities are noted in the dominant LCx. Moderately to severely reduced left ventricular systolic function (LVEF 30-35%) with mid/apical anterior and apical inferior hypokinesis/akinesis. Normal left ventricular filling pressure (LVEDP 7 mmHg). Successful IVUS-guided PCI to mid LAD using Onyx Frontier 2.25 x 30 mm drug-eluting stent (postdilated to 3.0 mm proximally) with 0% residual stenosis and TIMI-3 flow.  Mid LAD occlusion was challenging to cross and was complicated by wire perforation of a small branch (most likely a tiny diagonal).  Wire perforation controlled with balloon tamponade in the proximal LAD.  Small pericardial effusion noted at the time of perforation, unchanged case.  The patient remained hemodynamically stable throughout the procedure. Small and tortuous right radial artery, not suitable for catheterization.  Procedure performed via the right ulnar artery. Recommendations: Transfer to [facility] cardiac ICU for monitoring given wire perforation and small pericardial effusion.  If patient becomes hemodynamically unstable, STAT echocardiogram and consultation with interventional cardiology/cardiac surgery will need to be pursued.  Case discussed with interventional cardiology team at [facility]. Obtain echocardiogram tomorrow morning, sooner if patient becomes unstable. Dual antiplatelet therapy with aspirin  and ticagrelor  for at least 12 months. Aggressive secondary prevention of CAD.  Consider initiation of PCSK9 inhibitor at discharge given history of statin intolerance. Lonni Hanson, MD [facility]  DG Chest 1 View Result Date: 12/31/2023 CLINICAL DATA:  355200 with chest pain.  History of hypertension. EXAM: CHEST  1 VIEW COMPARISON:  None Available. FINDINGS: The heart size and mediastinal  contours are within normal limits. Both lungs are clear. The visualized skeletal structures are unremarkable. There are artifacts from overlying clothing. IMPRESSION: No evidence of acute chest disease. Electronically Signed   By: Francis Quam M.D.   On: 12/31/2023 08:00     Assessment and Plan:   NSTEMI: Patient presented with at least 12 hours of chest pain, found to have significantly elevated troponin and ischemic EKG changes outlined  above.  Catheterization this afternoon showed occluded LAD that was treated with PCI but complicated by perforation of a tiny branch. -Continue dual antiplatelet therapy with aspirin  and ticagrelor  for at least 12 months. -Defer beta-blocker today in the setting of coronary perforation. -Continue ezetimibe .  Anticipate PCSK9 inhibitor at discharge given history of statin intolerance.  Coronary perforation and pericardial effusion: PCI to the LAD complicated by perforation and contrast extravasation from a small diagonal branch, successfully controlled in the Cath Lab.  Serial echocardiograms in the Cath Lab showed stable small pericardial effusion without evidence of tamponade physiology.  The patient has remained hemodynamically stable. -Obtain echocardiogram in the morning, urgently overnight if the patient becomes hemodynamically unstable. -Gentle hydration in the setting of low normal LVEDP during catheterization.  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  LVEDP normal. -Gentle hydration in the setting of coronary perforation and pericardial effusion in order to minimize risk for hypotension. -Defer adding a beta-blocker or ACE inhibitor at this time.  Once it is clear that pericardial effusion is not growing, GDMT will need to be initiated. -Follow-up echocardiogram.  Hypertension: Blood pressure normal to mildly elevated.  In the setting of NSTEMI and  coronary perforation, we will defer adding antihypertensive agents this evening. -As needed labetalol  and hydralazine  available if necessary.   Risk Assessment/Risk Scores:    TIMI Risk Score for Unstable Angina or Non-ST Elevation MI:   The patient's TIMI risk score is 4, which indicates a 20% risk of all cause mortality, new or recurrent myocardial infarction or need for urgent revascularization in the next 14 days.  Code Status: Full Code  Severity of Illness: The appropriate patient status for this patient is INPATIENT. Inpatient status is judged to be reasonable and necessary in order to provide the required intensity of service to ensure the patient's safety. The patient's presenting symptoms, physical exam findings, and initial radiographic and laboratory data in the context of their chronic comorbidities is felt to place them at high risk for further clinical deterioration. Furthermore, it is not anticipated that the patient will be medically stable for discharge from the hospital within 2 midnights of admission.   * I certify that at the point of admission it is my clinical judgment that the patient will require inpatient hospital care spanning beyond 2 midnights from the point of admission due to high intensity of service, high risk for further deterioration and high frequency of surveillance required.*   For questions or updates, please contact [facility] Please consult [email or web address] for contact info under   Signed, Lonni Hanson, MD  12/31/2023 7:21 PM

## 2023-12-31 NOTE — ED Notes (Signed)
 CRITICAL VALUE STICKER  CRITICAL VALUE: Trop 1610  DATE & TIME NOTIFIED: 12/31/23 0726  MD NOTIFIED: Dr Fuller Plan  TIME OF NOTIFICATION: 9604  RESPONSE:  Acknowledged

## 2023-12-31 NOTE — H&P (Signed)
 History and Physical    Patient: Sheila Marquez FMW:[id number] DOB: [birth year] DOA: 12/31/2023 DOS: the patient was seen and examined on 12/31/2023 PCP: Sherial Bail, MD  Patient coming from: Home  Chief Complaint:  Chief Complaint  Patient presents with   Chest Pain   HPI: Sheila Marquez is a 73 y.o. female with medical history significant of hypertension hyperlipidemia presenting with NSTEMI.  Patient reports developing central chest pain yesterday evening.  Moderate to severe in nature.  Patient states she fell like something was crushing her chest.  Positive nausea and diaphoresis.  No shortness of breath.  No focal hemiparesis or confusion.  Patient denies any prior episodes like this in the past.  No abdominal pain.  No diarrhea.  Symptoms recurred over the course of the evening prompting patient to be evaluated.  Positive headache.  Baseline hypertension hyperlipidemia has been fairly well-controlled.  Noted prior statin intolerance. Presented to the ER afebrile, hemodynamically stable.  Satting well on room air.  Labs notable for white count of 12.2, hemoglobin 16.4, platelets of 245, creatinine of 0.75, troponin of 7862.  EKG normal sinus rhythm with noted T wave inversions in anterior septal leads.  Started on a heparin  drip with NSTEMI protocol.  Cardiology consulted for further evaluation with Dr. Gollan.  Plan is tentatively for cardiac catheterization today. Review of Systems: As mentioned in the history of present illness. All other systems reviewed and are negative. Past Medical History:  Diagnosis Date   Eczema    Heart murmur    Hyperlipidemia    Hypertension    Plantar fasciitis    Past Surgical History:  Procedure Laterality Date   ANAL FISSURECTOMY     ANAL SPHINCTEROTOMY     COLONOSCOPY     COLONOSCOPY WITH PROPOFOL  N/A 05/26/2020   Procedure: COLONOSCOPY WITH PROPOFOL ;  Surgeon: Dessa Reyes ORN, MD;  Location: [facility] ENDOSCOPY;  Service: Endoscopy;   Laterality: N/A;   KNEE ARTHROSCOPY     Social History:  reports that she has never smoked. She has never used smokeless tobacco. She reports current alcohol use. She reports that she does not use drugs.  Allergies  Allergen Reactions   Hydrocodone Nausea And Vomiting   Hydrocodone-Acetaminophen  Nausea And Vomiting   Propoxyphene Nausea And Vomiting   Statins Other (See Comments)    MUSCLE PAIN    Family History  Problem Relation Age of Onset   CAD Brother    Breast cancer Cousin        mat cousin    Prior to Admission medications   Medication Sig Start Date End Date Taking? Authorizing Provider  amLODipine (NORVASC) 2.5 MG tablet Take 2.5 mg by mouth daily.    [provider]  aspirin  EC 81 MG tablet Take 81 mg by mouth daily.    [provider]  calcium carbonate (OSCAL) 1500 (600 Ca) MG TABS tablet Take 600 mg of elemental calcium by mouth 2 (two) times daily with a meal.    [provider]  cetirizine (ZYRTEC) 10 MG tablet Take 10 mg by mouth daily.    [provider]  Cinnamon 500 MG capsule Take 500 mg by mouth daily.    [provider]  diphenhydrAMINE (BENADRYL) 25 MG tablet Take 25 mg by mouth every 6 (six) hours as needed.    [provider]  glucosamine-chondroitin 500-400 MG tablet Take 1 tablet by mouth 2 (two) times daily.    [provider]  magnesium oxide (MAG-OX) 400  MG tablet Take 400 mg by mouth daily.    [provider]  MEGARED OMEGA-3 KRILL OIL PO Take 750 mg by mouth daily at 2 am.    [provider]  psyllium (METAMUCIL) 58.6 % packet Take 1 packet by mouth 2 (two) times daily.    [provider]  vitamin E 1000 UNIT capsule Take 1,000 Units by mouth daily.    [provider]  zinc gluconate 50 MG tablet Take 50 mg by mouth daily.    [provider]    Physical Exam: Vitals:   12/31/23 0614 12/31/23 0617 12/31/23 0803  BP:  138/85 (!) 153/76   Pulse:  (!) 104 84  Resp:  20 17  Temp:  98.2 F (36.8 C)   TempSrc:  Oral   SpO2:  100% 100%  Weight: 68 kg    Height: 5' 2 (1.575 m)     Physical Exam Constitutional:      Appearance: She is normal weight.  HENT:     Head: Normocephalic and atraumatic.     Nose: Nose normal.     Mouth/Throat:     Mouth: Mucous membranes are moist.  Eyes:     Pupils: Pupils are equal, round, and reactive to light.  Cardiovascular:     Rate and Rhythm: Normal rate and regular rhythm.  Pulmonary:     Effort: Pulmonary effort is normal.  Abdominal:     General: Bowel sounds are normal.  Musculoskeletal:        General: Normal range of motion.  Skin:    General: Skin is warm.  Neurological:     General: No focal deficit present.  Psychiatric:        Mood and Affect: Mood normal.     Data Reviewed:  There are no new results to review at this time.  Assessment and Plan: * NSTEMI (non-ST elevated myocardial infarction) (HCC) Central chest pain since yesterday evening  Troponin 7800 EKG NSR w/ noted TWI in anteroseptal leads  Heparin  gtt  ASA  Tentative plan for cardiac catheterization today  Risk stratification labs 2D ECHO  Follow up formal cardiology recommendations    HTN (hypertension) BP stable  Titrate home regimen    HLD (hyperlipidemia) Lipid panel pending in setting of NSTEMI  Noted baseline statin intolerance  Lipid management per cardiology recommendations        Advance Care Planning:   Code Status: Full Code   Consults: Cardiology  Family Communication: Daughter at the bedside   Severity of Illness: The appropriate patient status for this patient is INPATIENT. Inpatient status is judged to be reasonable and necessary in order to provide the required intensity of service to ensure the patient's safety. The patient's presenting symptoms, physical exam findings, and initial radiographic and laboratory data in the context of their chronic comorbidities  is felt to place them at high risk for further clinical deterioration. Furthermore, it is not anticipated that the patient will be medically stable for discharge from the hospital within 2 midnights of admission.   * I certify that at the point of admission it is my clinical judgment that the patient will require inpatient hospital care spanning beyond 2 midnights from the point of admission due to high intensity of service, high risk for further deterioration and high frequency of surveillance required.*  Author: Elspeth JINNY Masters, MD 12/31/2023 8:51 AM  For on call review [email or web address].

## 2023-12-31 NOTE — ED Triage Notes (Signed)
 Pt reports intermittent chest pain that began yesterday, pt states pain became worse overnight. Pt states she has a headache associated with the pain. Pt describes pain in chest as "someone punching me"

## 2023-12-31 NOTE — Assessment & Plan Note (Signed)
 Central chest pain since yesterday evening  Troponin 7800 EKG NSR w/ noted TWI in anteroseptal leads  Heparin gtt  ASA  Tentative plan for cardiac catheterization today  Risk stratification labs 2D ECHO  Follow up formal cardiology recommendations

## 2023-12-31 NOTE — Progress Notes (Cosign Needed)
 Patient arrived from [facility], states she feels ok. VS stable. No chest pain or SOB. Cardiac S1S2, no murmur. Lung clear, on room air. She has TR band on, right wrist with bruised area beyond the TR band. Pulse +. Right hand warm. + subjective tenderness. Advised RN to apply manual pressure to bruised area, mark the area, monitor for progression. If concern of worsening hematoma, will need ultrasound tonight. Will sign out to overnight fellow. Call back if any concern please

## 2023-12-31 NOTE — Consult Note (Signed)
 Cardiology Consultation:   Patient ID: Sheila Marquez; [id number]; 1951-06-27   Admit date: 12/31/2023 Date of Consult: 12/31/2023  Primary Care Provider: Sherial Bail, MD Primary Cardiologist: New - consult by [facility] Primary Electrophysiologist:  None   Patient Profile:   Sheila Marquez is a 73 y.o. female with a hx of HTN and HLD with statin intolerance on ezetimibe  who is being seen today for the evaluation of NSTEMI at the request of Dr. Ernest, MD.  History of Present Illness:   Ms. Sheila Marquez has no previously known cardiac history. Last evening, while preparing dinner, she developed sudden onset of substernal chest pain without radiation with associated SOB, nausea, non-bloody emesis, and diaphoresis. Pain has been intermittent since. She did not seek medical care at that time. This morning, she continued to have substernal chest pain and presented to [facility] where she was found to have an NSTEMI with an initial HS-Tn of 7862. EKG showed sinus tachycardia 104 bpm, anterolateral TWI with subtle 1 mm anterior st elevation, baseline wandering, poor R wave progression. CXR without evidence of acute process. In the ED she received ASA 324 mg, morphine , SL NTG, and Zofran . She has been placed on a heparin  gtt. leading up to her presentation, she noted an increase in fatigue as well as an episode of chest tightness approximately 2 weeks prior undergoing water aerobics.  Never with the pain similar.  Currently, she continues to note intermittent substernal chest pain, recently received IV morphine .    Past Medical History:  Diagnosis Date   Eczema    Heart murmur    Hyperlipidemia    Hypertension    Plantar fasciitis     Past Surgical History:  Procedure Laterality Date   ANAL FISSURECTOMY     ANAL SPHINCTEROTOMY     COLONOSCOPY     COLONOSCOPY WITH PROPOFOL  N/A 05/26/2020   Procedure: COLONOSCOPY WITH PROPOFOL ;  Surgeon: Dessa Reyes ORN, MD;  Location: [facility] ENDOSCOPY;   Service: Endoscopy;  Laterality: N/A;   KNEE ARTHROSCOPY       Home Meds: Prior to Admission medications   Medication Sig Start Date End Date Taking? Authorizing Provider  amLODipine (NORVASC) 2.5 MG tablet Take 2.5 mg by mouth daily.    [provider]  aspirin  EC 81 MG tablet Take 81 mg by mouth daily.    [provider]  calcium carbonate (OSCAL) 1500 (600 Ca) MG TABS tablet Take 600 mg of elemental calcium by mouth 2 (two) times daily with a meal.    [provider]  cetirizine (ZYRTEC) 10 MG tablet Take 10 mg by mouth daily.    [provider]  Cinnamon 500 MG capsule Take 500 mg by mouth daily.    [provider]  diphenhydrAMINE (BENADRYL) 25 MG tablet Take 25 mg by mouth every 6 (six) hours as needed.    [provider]  glucosamine-chondroitin 500-400 MG tablet Take 1 tablet by mouth 2 (two) times daily.    [provider]  magnesium oxide (MAG-OX) 400 MG tablet Take 400 mg by mouth daily.    [provider]  MEGARED OMEGA-3 KRILL OIL PO Take 750 mg by mouth daily at 2 am.    [provider]  psyllium (METAMUCIL) 58.6 % packet Take 1 packet by mouth 2 (two) times daily.    [provider]  vitamin E 1000 UNIT capsule Take 1,000 Units by mouth daily.    [provider]  zinc gluconate 50 MG tablet  Take 50 mg by mouth daily.    [provider]    Inpatient Medications: Scheduled Meds:  aspirin   324 mg Oral NOW   Or   aspirin   300 mg Rectal NOW   [START ON 01/01/2024] aspirin  EC  81 mg Oral Daily   Continuous Infusions:  heparin  750 Units/hr (12/31/23 0816)   PRN Meds: acetaminophen , nitroGLYCERIN , ondansetron  (ZOFRAN ) IV  Allergies:   Allergies  Allergen Reactions   Hydrocodone Nausea And Vomiting   Hydrocodone-Acetaminophen  Nausea And Vomiting   Propoxyphene Nausea And Vomiting   Statins Other (See Comments)    MUSCLE PAIN    Social History:   Social History    Socioeconomic History   Marital status: Married    Spouse name: Not on file   Number of children: Not on file   Years of education: Not on file   Highest education level: Not on file  Occupational History   Not on file  Tobacco Use   Smoking status: Never   Smokeless tobacco: Never  Vaping Use   Vaping status: Never Used  Substance and Sexual Activity   Alcohol use: Yes    Comment: none last 24hrs   Drug use: Never   Sexual activity: Not on file  Other Topics Concern   Not on file  Social History Narrative   Not on file   Social Drivers of Health   Financial Resource Strain: Low Risk  (10/20/2023)   Received from [facility]   Overall Financial Resource Strain (CARDIA)    Difficulty of Paying Living Expenses: Not hard at all  Food Insecurity: No Food Insecurity (10/20/2023)   Received from [facility]   Hunger Vital Sign    Worried About Running Out of Food in the Last Year: Never true    Ran Out of Food in the Last Year: Never true  Transportation Needs: No Transportation Needs (10/20/2023)   Received from [facility] - Transportation    In the past 12 months, has lack of transportation kept you from medical appointments or from getting medications?: No    Lack of Transportation (Non-Medical): No  Physical Activity: Not on file  Stress: Not on file  Social Connections: Not on file  Intimate Partner Violence: Not on file     Family History:   Family History  Problem Relation Age of Onset   CAD Brother    Breast cancer Cousin        mat cousin    ROS:  Review of Systems  Constitutional:  Positive for diaphoresis and malaise/fatigue. Negative for chills, fever and weight loss.  HENT:  Negative for congestion.   Eyes:  Negative for discharge and redness.  Respiratory:  Positive for shortness of breath. Negative for cough, sputum production and wheezing.   Cardiovascular:  Positive for chest  pain. Negative for palpitations, orthopnea, claudication, leg swelling and PND.  Gastrointestinal:  Positive for nausea and vomiting. Negative for abdominal pain and heartburn.  Musculoskeletal:  Negative for falls and myalgias.  Skin:  Negative for rash.  Neurological:  Negative for dizziness, tingling, tremors, sensory change, speech change, focal weakness and loss of consciousness.  Endo/Heme/Allergies:  Does not bruise/bleed easily.  Psychiatric/Behavioral:  Negative for substance abuse. The patient is not nervous/anxious.   All other systems reviewed and are negative.     Physical Exam/Data:   Vitals:   12/31/23 0614 12/31/23 0617 12/31/23 0803  BP:  138/85 (!) 153/76  Pulse:  (!) 104 84  Resp:  20 17  Temp:  98.2 F (36.8 C)   TempSrc:  Oral   SpO2:  100% 100%  Weight: 68 kg    Height: 5' 2 (1.575 m)     No intake or output data in the 24 hours ending 12/31/23 0822 Filed Weights   12/31/23 0614  Weight: 68 kg   Body mass index is 27.44 kg/m.   Physical Exam: General: Well developed, well nourished, in no acute distress. Head: Normocephalic, atraumatic, sclera non-icteric, no xanthomas, nares without discharge.  Neck: Negative for carotid bruits. JVD not elevated. Lungs: Clear bilaterally to auscultation without wheezes, rales, or rhonchi. Breathing is unlabored. Heart: RRR with S1 S2. No murmurs, rubs, or gallops appreciated. Abdomen: Soft, non-tender, non-distended with normoactive bowel sounds. No hepatomegaly. No rebound/guarding. No obvious abdominal masses. Msk:  Strength and tone appear normal for age. Extremities: No clubbing or cyanosis. No edema. Distal pedal pulses are 2+ and equal bilaterally. Neuro: Alert and oriented X 3. No facial asymmetry. No focal deficit. Moves all extremities spontaneously. Psych:  Responds to questions appropriately with a normal affect.   EKG:  The EKG was personally reviewed and demonstrates: sinus tachycardia 104 bpm,  anterolateral TWI with subtle 1 mm anterior st elevation, baseline wandering, poor R wave progression Telemetry:  Telemetry was personally reviewed and demonstrates: SR  Weights: [employer]   12/31/23 0614  Weight: 68 kg    Relevant CV Studies:  None available for review.   Laboratory Data:  Chemistry Recent Labs  Lab 12/31/23 0616  NA 136  K 4.2  CL 102  CO2 19*  GLUCOSE 134*  BUN 12  CREATININE 0.75  CALCIUM 10.0  GFRNONAA >60  ANIONGAP 15    No results for input(s): PROT, ALBUMIN, AST, ALT, ALKPHOS, BILITOT in the last 168 hours. Hematology Recent Labs  Lab 12/31/23 0616  WBC 12.2*  RBC 5.13*  HGB 16.4*  HCT 46.9*  MCV 91.4  MCH 32.0  MCHC 35.0  RDW 13.0  PLT 245   Cardiac EnzymesNo results for input(s): TROPONINI in the last 168 hours. No results for input(s): TROPIPOC in the last 168 hours.  BNPNo results for input(s): BNP, PROBNP in the last 168 hours.  DDimer No results for input(s): DDIMER in the last 168 hours.  Radiology/Studies:  DG Chest 1 View Result Date: 12/31/2023 IMPRESSION: No evidence of acute chest disease. Electronically Signed   By: Francis Quam M.D.   On: 12/31/2023 08:00    Assessment and Plan:   1. NSTEMI: -Currently continues to note substernal chest pressure status post recent IV morphine , continue antianginal therapy in an effort to achieve resolution of angina -N.p.o. -Plan for LHC today -Obtain echo -Heparin  drip -Aspirin  -Recent LDL 66 in 03/2023 -Resume PTA ezetimibe  10 mg upon admission -Has noted myalgias with statins, may need to pursue PCSK9 -Further recommendations pending LHC findings  2.  HTN: -Blood pressure reasonably controlled in the 130s to 150s systolic -PTA amlodipine  3.  HLD with statin intolerance: -PTA ezetimibe  -May need to pursue PCSK9 inhibitor versus bempedoic acid   Informed Consent   Shared Decision Making/Informed Consent{  The risks [stroke (1 in 1000),  death (1 in 1000), kidney failure [usually temporary] (1 in 500), bleeding (1 in 200), allergic reaction [possibly serious] (1 in 200)], benefits (diagnostic support and management of coronary artery disease) and alternatives of a cardiac catheterization were discussed in detail with Ms. Lecompte and she is  willing to proceed.       For questions or updates, please contact [facility] Please consult [email or web address] for contact info under Cardiology/STEMI.   Signed, Bernardino Bring, PA-C [facility] Pager: [phone] 12/31/2023, 8:22 AM

## 2023-12-31 NOTE — ED Provider Notes (Signed)
 [facility] Provider Note    None    (approximate)   History   Chest Pain   HPI  Sheila Marquez is a 73 y.o. female  with HTN and HLD who comes in with chest pain.  Patient reports having some chest pain that started yesterday and then got worse overnight.  She describes it as a pressure sensation that goes into her arm.  She states because of the chest pain she developed a headache but this is a typical headache for her.  She states that now she does not even have the headache any longer.  She denies any weakness, numbness in 1 arm or any other concerns.  She denies any history of heart attack, stent placement.  Denies ever feeling anything like this before.  No shortness of breath.      Physical Exam   Triage Vital Signs: ED Triage Vitals  Encounter Vitals Group     BP 12/31/23 0617 138/85     Systolic BP Percentile --      Diastolic BP Percentile --      Pulse Rate 12/31/23 0617 (!) 104     Resp 12/31/23 0617 20     Temp 12/31/23 0617 98.2 F (36.8 C)     Temp Source 12/31/23 0617 Oral     SpO2 12/31/23 0617 100 %     Weight 12/31/23 0614 150 lb (68 kg)     Height 12/31/23 0614 5' 2 (1.575 m)     Head Circumference --      Peak Flow --      Pain Score 12/31/23 0614 5     Pain Loc --      Pain Education --      Exclude from Growth Chart --     Most recent vital signs: Vitals:   12/31/23 0617  BP: 138/85  Pulse: (!) 104  Resp: 20  Temp: 98.2 F (36.8 C)  SpO2: 100%     General: Awake, no distress.  CV:  Good peripheral perfusion.  Resp:  Normal effort.  Abd:  No distention.  Other:  Equal radial pulses sensation intact throughout.  Strength intact throughout.  Cranial nerves intact.   ED Results / Procedures / Treatments   Labs (all labs ordered are listed, but only abnormal results are displayed) Labs Reviewed  BASIC METABOLIC PANEL - Abnormal; Notable for the following components:      Result Value   CO2 19 (*)     Glucose, Bld 134 (*)    All other components within normal limits  CBC - Abnormal; Notable for the following components:   WBC 12.2 (*)    RBC 5.13 (*)    Hemoglobin 16.4 (*)    HCT 46.9 (*)    All other components within normal limits  TROPONIN I (HIGH SENSITIVITY) - Abnormal; Notable for the following components:   Troponin I (High Sensitivity) [phone] (*)    All other components within normal limits     EKG  My interpretation of EKG:  Sinus tachycardia rate of 104 without any significant ST elevation except for maybe a little bit in the 2, T wave inversions in V2, normal levels  Repeat EKG is sinus rate of 94 with similar ST sloping in V2 no ST elevation, T wave inversions in V2, normal intervals   RADIOLOGY I have reviewed the xray personally and no widen mediastinum  PROCEDURES:  Critical Care performed: Yes, see critical care procedure note(s)  .  Critical Care  Performed by: Ernest Ronal BRAVO, MD Authorized by: Ernest Ronal BRAVO, MD   Critical care provider statement:    Critical care time (minutes):  30   Critical care was necessary to treat or prevent imminent or life-threatening deterioration of the following conditions:  Cardiac failure   Critical care was time spent personally by me on the following activities:  Development of treatment plan with patient or surrogate, discussions with consultants, evaluation of patient's response to treatment, examination of patient, ordering and review of laboratory studies, ordering and review of radiographic studies, ordering and performing treatments and interventions, pulse oximetry, re-evaluation of patient's condition and review of old charts .1-3 Lead EKG Interpretation  Performed by: Ernest Ronal BRAVO, MD Authorized by: Ernest Ronal BRAVO, MD     Interpretation: normal     ECG rate:  90   ECG rate assessment: normal     Rhythm: sinus rhythm     Ectopy: none     Conduction: normal      MEDICATIONS ORDERED IN ED: Medications  aspirin   chewable tablet 324 mg (has no administration in time range)  morphine  (PF) 4 MG/ML injection 4 mg (has no administration in time range)  ondansetron  (ZOFRAN ) injection 4 mg (has no administration in time range)     IMPRESSION / MDM / ASSESSMENT AND PLAN / ED COURSE  I reviewed the triage vital signs and the nursing notes.   Patient's presentation is most consistent with acute presentation with potential threat to life or bodily function.   Patient comes in with chest pain.  EKG was some concern for ischemia in lead V2 with deeply inverted T wave but does not meet STEMI criteria.  Repeat EKG is unchanged.  Doubt dissection.  Good radial pulses no numbness, tingling.  Initially reported a headache in triage but she typically does get headaches and she states that she feels like her headache is just because of how bad her chest hurts.  She states that she currently does not have a headache and she is neuro intact.  Discussed CT imaging prior to heparin  initiation and have opted to hold off given no current headache and neuro intact.  Troponin was over 7000.  BMP reassuring CBC reassuring.  8:04 AM reevaluated patient.  Chest pain has come down to a 3 out of 10.  Will trial some nitro.  Patient does report that her headache continues to be resolved. and we can start heparin   Discussed with Dr Gollan- plan for urgent Cath but no STEMI- D/w Hospital for admission.   The patient is on the cardiac monitor to evaluate for evidence of arrhythmia and/or significant heart rate changes.      FINAL CLINICAL IMPRESSION(S) / ED DIAGNOSES   Final diagnoses:  NSTEMI (non-ST elevated myocardial infarction) (HCC)     Rx / DC Orders   ED Discharge Orders     None        Note:  This document was prepared using Dragon voice recognition software and may include unintentional dictation errors.   Ernest Ronal BRAVO, MD 12/31/23 [phone]

## 2023-12-31 NOTE — Interval H&P Note (Signed)
 History and Physical Interval Note:  12/31/2023 4:28 PM  Sheila Marquez  has presented today for surgery, with the diagnosis of non-ST segment myocardial infarction.  The various methods of treatment have been discussed with the patient and family. After consideration of risks, benefits and other options for treatment, the patient has consented to  Procedure(s): LEFT HEART CATH AND CORONARY ANGIOGRAPHY (N/A) as a surgical intervention.  The patient's history has been reviewed, patient examined, no change in status, stable for surgery.  I have reviewed the patient's chart and labs.  Questions were answered to the patient's satisfaction.    Cath Lab Visit (complete for each Cath Lab visit)  Clinical Evaluation Leading to the Procedure:   ACS: Yes.    Non-ACS:  N/A  Kainan Patty

## 2023-12-31 NOTE — Assessment & Plan Note (Signed)
 Lipid panel pending in setting of NSTEMI  Noted baseline statin intolerance  Lipid management per cardiology recommendations

## 2023-12-31 NOTE — H&P (View-Only) (Signed)
 Cardiology Consultation:   Patient ID: Sheila Marquez; [id number]; 1951-06-27   Admit date: 12/31/2023 Date of Consult: 12/31/2023  Primary Care Provider: Sherial Bail, MD Primary Cardiologist: New - consult by [facility] Primary Electrophysiologist:  None   Patient Profile:   Sheila Marquez is a 73 y.o. female with a hx of HTN and HLD with statin intolerance on ezetimibe  who is being seen today for the evaluation of NSTEMI at the request of Dr. Ernest, MD.  History of Present Illness:   [name] has no previously known cardiac history. Last evening, while preparing dinner, she developed sudden onset of substernal chest pain without radiation with associated SOB, nausea, non-bloody emesis, and diaphoresis. Pain has been intermittent since. She did not seek medical care at that time. This morning, she continued to have substernal chest pain and presented to [facility] where she was found to have an NSTEMI with an initial HS-Tn of 7862. EKG showed sinus tachycardia 104 bpm, anterolateral TWI with subtle 1 mm anterior st elevation, baseline wandering, poor R wave progression. CXR without evidence of acute process. In the ED she received ASA 324 mg, morphine , SL NTG, and Zofran . She has been placed on a heparin  gtt. leading up to her presentation, she noted an increase in fatigue as well as an episode of chest tightness approximately 2 weeks prior undergoing water aerobics.  Never with the pain similar.  Currently, she continues to note intermittent substernal chest pain, recently received IV morphine .    Past Medical History:  Diagnosis Date   Eczema    Heart murmur    Hyperlipidemia    Hypertension    Plantar fasciitis     Past Surgical History:  Procedure Laterality Date   ANAL FISSURECTOMY     ANAL SPHINCTEROTOMY     COLONOSCOPY     COLONOSCOPY WITH PROPOFOL  N/A 05/26/2020   Procedure: COLONOSCOPY WITH PROPOFOL ;  Surgeon: Dessa Reyes ORN, MD;  Location: [facility] ENDOSCOPY;   Service: Endoscopy;  Laterality: N/A;   KNEE ARTHROSCOPY       Home Meds: Prior to Admission medications   Medication Sig Start Date End Date Taking? Authorizing Provider  amLODipine (NORVASC) 2.5 MG tablet Take 2.5 mg by mouth daily.    [provider]  aspirin  EC 81 MG tablet Take 81 mg by mouth daily.    [provider]  calcium carbonate (OSCAL) 1500 (600 Ca) MG TABS tablet Take 600 mg of elemental calcium by mouth 2 (two) times daily with a meal.    [provider]  cetirizine (ZYRTEC) 10 MG tablet Take 10 mg by mouth daily.    [provider]  Cinnamon 500 MG capsule Take 500 mg by mouth daily.    [provider]  diphenhydrAMINE (BENADRYL) 25 MG tablet Take 25 mg by mouth every 6 (six) hours as needed.    [provider]  glucosamine-chondroitin 500-400 MG tablet Take 1 tablet by mouth 2 (two) times daily.    [provider]  magnesium oxide (MAG-OX) 400 MG tablet Take 400 mg by mouth daily.    [provider]  MEGARED OMEGA-3 KRILL OIL PO Take 750 mg by mouth daily at 2 am.    [provider]  psyllium (METAMUCIL) 58.6 % packet Take 1 packet by mouth 2 (two) times daily.    [provider]  vitamin E 1000 UNIT capsule Take 1,000 Units by mouth daily.    [provider]  zinc gluconate 50 MG tablet  Take 50 mg by mouth daily.    [provider]    Inpatient Medications: Scheduled Meds:  aspirin   324 mg Oral NOW   Or   aspirin   300 mg Rectal NOW   [START ON 01/01/2024] aspirin  EC  81 mg Oral Daily   Continuous Infusions:  heparin  750 Units/hr (12/31/23 0816)   PRN Meds: acetaminophen , nitroGLYCERIN , ondansetron  (ZOFRAN ) IV  Allergies:   Allergies  Allergen Reactions   Hydrocodone Nausea And Vomiting   Hydrocodone-Acetaminophen  Nausea And Vomiting   Propoxyphene Nausea And Vomiting   Statins Other (See Comments)    MUSCLE PAIN    Social History:   Social History    Socioeconomic History   Marital status: Married    Spouse name: Not on file   Number of children: Not on file   Years of education: Not on file   Highest education level: Not on file  Occupational History   Not on file  Tobacco Use   Smoking status: Never   Smokeless tobacco: Never  Vaping Use   Vaping status: Never Used  Substance and Sexual Activity   Alcohol use: Yes    Comment: none last 24hrs   Drug use: Never   Sexual activity: Not on file  Other Topics Concern   Not on file  Social History Narrative   Not on file   Social Drivers of Health   Financial Resource Strain: Low Risk  (10/20/2023)   Received from [facility]   Overall Financial Resource Strain (CARDIA)    Difficulty of Paying Living Expenses: Not hard at all  Food Insecurity: No Food Insecurity (10/20/2023)   Received from [facility]   Hunger Vital Sign    Worried About Running Out of Food in the Last Year: Never true    Ran Out of Food in the Last Year: Never true  Transportation Needs: No Transportation Needs (10/20/2023)   Received from [facility] - Transportation    In the past 12 months, has lack of transportation kept you from medical appointments or from getting medications?: No    Lack of Transportation (Non-Medical): No  Physical Activity: Not on file  Stress: Not on file  Social Connections: Not on file  Intimate Partner Violence: Not on file     Family History:   Family History  Problem Relation Age of Onset   CAD Brother    Breast cancer Cousin        mat cousin    ROS:  Review of Systems  Constitutional:  Positive for diaphoresis and malaise/fatigue. Negative for chills, fever and weight loss.  HENT:  Negative for congestion.   Eyes:  Negative for discharge and redness.  Respiratory:  Positive for shortness of breath. Negative for cough, sputum production and wheezing.   Cardiovascular:  Positive for chest  pain. Negative for palpitations, orthopnea, claudication, leg swelling and PND.  Gastrointestinal:  Positive for nausea and vomiting. Negative for abdominal pain and heartburn.  Musculoskeletal:  Negative for falls and myalgias.  Skin:  Negative for rash.  Neurological:  Negative for dizziness, tingling, tremors, sensory change, speech change, focal weakness and loss of consciousness.  Endo/Heme/Allergies:  Does not bruise/bleed easily.  Psychiatric/Behavioral:  Negative for substance abuse. The patient is not nervous/anxious.   All other systems reviewed and are negative.     Physical Exam/Data:   Vitals:   12/31/23 0614 12/31/23 0617 12/31/23 0803  BP:  138/85 (!) 153/76  Pulse:  (!) 104 84  Resp:  20 17  Temp:  98.2 F (36.8 C)   TempSrc:  Oral   SpO2:  100% 100%  Weight: 68 kg    Height: 5' 2 (1.575 m)     No intake or output data in the 24 hours ending 12/31/23 0822 Filed Weights   12/31/23 0614  Weight: 68 kg   Body mass index is 27.44 kg/m.   Physical Exam: General: Well developed, well nourished, in no acute distress. Head: Normocephalic, atraumatic, sclera non-icteric, no xanthomas, nares without discharge.  Neck: Negative for carotid bruits. JVD not elevated. Lungs: Clear bilaterally to auscultation without wheezes, rales, or rhonchi. Breathing is unlabored. Heart: RRR with S1 S2. No murmurs, rubs, or gallops appreciated. Abdomen: Soft, non-tender, non-distended with normoactive bowel sounds. No hepatomegaly. No rebound/guarding. No obvious abdominal masses. Msk:  Strength and tone appear normal for age. Extremities: No clubbing or cyanosis. No edema. Distal pedal pulses are 2+ and equal bilaterally. Neuro: Alert and oriented X 3. No facial asymmetry. No focal deficit. Moves all extremities spontaneously. Psych:  Responds to questions appropriately with a normal affect.   EKG:  The EKG was personally reviewed and demonstrates: sinus tachycardia 104 bpm,  anterolateral TWI with subtle 1 mm anterior st elevation, baseline wandering, poor R wave progression Telemetry:  Telemetry was personally reviewed and demonstrates: SR  Weights: [employer]   12/31/23 0614  Weight: 68 kg    Relevant CV Studies:  None available for review.   Laboratory Data:  Chemistry Recent Labs  Lab 12/31/23 0616  NA 136  K 4.2  CL 102  CO2 19*  GLUCOSE 134*  BUN 12  CREATININE 0.75  CALCIUM 10.0  GFRNONAA >60  ANIONGAP 15    No results for input(s): PROT, ALBUMIN, AST, ALT, ALKPHOS, BILITOT in the last 168 hours. Hematology Recent Labs  Lab 12/31/23 0616  WBC 12.2*  RBC 5.13*  HGB 16.4*  HCT 46.9*  MCV 91.4  MCH 32.0  MCHC 35.0  RDW 13.0  PLT 245   Cardiac EnzymesNo results for input(s): TROPONINI in the last 168 hours. No results for input(s): TROPIPOC in the last 168 hours.  BNPNo results for input(s): BNP, PROBNP in the last 168 hours.  DDimer No results for input(s): DDIMER in the last 168 hours.  Radiology/Studies:  DG Chest 1 View Result Date: 12/31/2023 IMPRESSION: No evidence of acute chest disease. Electronically Signed   By: Francis Quam M.D.   On: 12/31/2023 08:00    Assessment and Plan:   1. NSTEMI: -Currently continues to note substernal chest pressure status post recent IV morphine , continue antianginal therapy in an effort to achieve resolution of angina -N.p.o. -Plan for LHC today -Obtain echo -Heparin  drip -Aspirin  -Recent LDL 66 in 03/2023 -Resume PTA ezetimibe  10 mg upon admission -Has noted myalgias with statins, may need to pursue PCSK9 -Further recommendations pending LHC findings  2.  HTN: -Blood pressure reasonably controlled in the 130s to 150s systolic -PTA amlodipine  3.  HLD with statin intolerance: -PTA ezetimibe  -May need to pursue PCSK9 inhibitor versus bempedoic acid   Informed Consent   Shared Decision Making/Informed Consent{  The risks [stroke (1 in 1000),  death (1 in 1000), kidney failure [usually temporary] (1 in 500), bleeding (1 in 200), allergic reaction [possibly serious] (1 in 200)], benefits (diagnostic support and management of coronary artery disease) and alternatives of a cardiac catheterization were discussed in detail with Ms. Lecompte and she is  willing to proceed.       For questions or updates, please contact [facility] Please consult [email or web address] for contact info under Cardiology/STEMI.   Signed, Bernardino Bring, PA-C [facility] Pager: [phone] 12/31/2023, 8:22 AM

## 2024-01-01 ENCOUNTER — Other Ambulatory Visit (HOSPITAL_COMMUNITY): Payer: Self-pay

## 2024-01-01 ENCOUNTER — Encounter: Payer: Self-pay | Admitting: Internal Medicine

## 2024-01-01 ENCOUNTER — Telehealth (HOSPITAL_COMMUNITY): Payer: Self-pay | Admitting: Pharmacy Technician

## 2024-01-01 ENCOUNTER — Inpatient Hospital Stay (HOSPITAL_COMMUNITY): Payer: PPO

## 2024-01-01 DIAGNOSIS — I214 Non-ST elevation (NSTEMI) myocardial infarction: Secondary | ICD-10-CM

## 2024-01-01 LAB — CBC
HCT: 37.8 % (ref 36.0–46.0)
Hemoglobin: 12.9 g/dL (ref 12.0–15.0)
MCH: 31.2 pg (ref 26.0–34.0)
MCHC: 34.1 g/dL (ref 30.0–36.0)
MCV: 91.3 fL (ref 80.0–100.0)
Platelets: 188 10*3/uL (ref 150–400)
RBC: 4.14 MIL/uL (ref 3.87–5.11)
RDW: 13.4 % (ref 11.5–15.5)
WBC: 10.9 10*3/uL — ABNORMAL HIGH (ref 4.0–10.5)
nRBC: 0 % (ref 0.0–0.2)

## 2024-01-01 LAB — ECHOCARDIOGRAM COMPLETE
AR max vel: 2.26 cm2
AV Peak grad: 8.1 mm[Hg]
Ao pk vel: 1.42 m/s
Area-P 1/2: 4.39 cm2
Calc EF: 48.7 %
Height: 62 in
S' Lateral: 2.5 cm
Single Plane A2C EF: 57.7 %
Single Plane A4C EF: 40.1 %
Weight: 2400 [oz_av]

## 2024-01-01 LAB — BASIC METABOLIC PANEL
Anion gap: 8 (ref 5–15)
BUN: 10 mg/dL (ref 8–23)
CO2: 19 mmol/L — ABNORMAL LOW (ref 22–32)
Calcium: 8.7 mg/dL — ABNORMAL LOW (ref 8.9–10.3)
Chloride: 107 mmol/L (ref 98–111)
Creatinine, Ser: 0.73 mg/dL (ref 0.44–1.00)
GFR, Estimated: >60 mL/min (ref >60)
Glucose, Bld: 121 mg/dL — ABNORMAL HIGH (ref 70–99)
Potassium: 3.6 mmol/L (ref 3.5–5.1)
Sodium: 134 mmol/L — ABNORMAL LOW (ref 135–145)

## 2024-01-01 MED ORDER — EZETIMIBE 10 MG PO TABS
10.0000 mg | ORAL_TABLET | Freq: Every day | ORAL | Status: DC
  Administered 2024-01-01 – 2024-01-02 (×2): 10 mg via ORAL
  Filled 2024-01-01 (×2): qty 1

## 2024-01-01 MED ORDER — SPIRONOLACTONE 12.5 MG HALF TABLET
12.5000 mg | ORAL_TABLET | Freq: Every day | ORAL | Status: DC
Start: 2024-01-01 — End: 2024-01-02
  Administered 2024-01-01 – 2024-01-02 (×2): 12.5 mg via ORAL
  Filled 2024-01-01 (×2): qty 1

## 2024-01-01 MED ORDER — METOPROLOL SUCCINATE ER 25 MG PO TB24
12.5000 mg | ORAL_TABLET | Freq: Every day | ORAL | Status: DC
  Administered 2024-01-01 – 2024-01-02 (×2): 12.5 mg via ORAL
  Filled 2024-01-01 (×2): qty 1

## 2024-01-01 NOTE — Telephone Encounter (Signed)
 Patient Product/process development scientist completed.    The patient is insured through [employer]/ [employer]. Patient has Medicare and is not eligible for a copay card, but may be able to apply for patient assistance or Medicare RX Payment Plan (Patient Must reach out to their plan, if eligible for payment plan), if available.    Ran test claim for Brilinta 90 mg and the current 30 day co-pay is $47.00.   This test claim was processed through [facility]- copay amounts may vary at other pharmacies due to pharmacy/plan contracts, or as the patient moves through the different stages of their insurance plan.     Roland Earl, CPHT Pharmacy Technician III Certified Patient Advocate [facility] Patient Advocate Team Direct Number: [phone]  Fax: [phone]

## 2024-01-01 NOTE — Plan of Care (Signed)
  Problem: Education: Goal: Knowledge of General Education information will improve Description: Including pain rating scale, medication(s)/side effects and non-pharmacologic comfort measures Outcome: Progressing   Problem: Health Behavior/Discharge Planning: Goal: Ability to manage health-related needs will improve Outcome: Progressing   Problem: Clinical Measurements: Goal: Ability to maintain clinical measurements within normal limits will improve Outcome: Progressing Goal: Will remain free from infection Outcome: Progressing Goal: Diagnostic test results will improve Outcome: Progressing Goal: Respiratory complications will improve Outcome: Progressing Goal: Cardiovascular complication will be avoided Outcome: Progressing   Problem: Activity: Goal: Risk for activity intolerance will decrease Outcome: Progressing   Problem: Nutrition: Goal: Adequate nutrition will be maintained Outcome: Progressing   Problem: Coping: Goal: Level of anxiety will decrease Outcome: Progressing   Problem: Elimination: Goal: Will not experience complications related to bowel motility Outcome: Progressing Goal: Will not experience complications related to urinary retention Outcome: Progressing   Problem: Pain Management: Goal: General experience of comfort will improve Outcome: Progressing   Problem: Safety: Goal: Ability to remain free from injury will improve Outcome: Progressing   Problem: Skin Integrity: Goal: Risk for impaired skin integrity will decrease Outcome: Progressing   Problem: Education: Goal: Understanding of CV disease, CV risk reduction, and recovery process will improve Outcome: Progressing   Problem: Activity: Goal: Ability to return to baseline activity level will improve Outcome: Progressing   Problem: Cardiovascular: Goal: Ability to achieve and maintain adequate cardiovascular perfusion will improve Outcome: Progressing   Problem: Health  Behavior/Discharge Planning: Goal: Ability to safely manage health-related needs after discharge will improve Outcome: Progressing

## 2024-01-01 NOTE — Progress Notes (Signed)
 CARDIAC REHAB PHASE I   PRE:  Rate/Rhythm: 85 SR  BP:  Sitting: 91/63      SaO2: 97 RA   MODE:  Ambulation: 170 ft   POST:  Rate/Rhythm: 92 SR   BP:  Sitting: 117/58      SaO2: 98 RA   Pt ambulated independently in hallway. Tolerated well with no CP, SOB or dizziness. Returned to chair with call bell and bedside table in reach. Post MI/stent education including restrictions, risk factors, exercise guidelines, antiplatelet therapy importance, MI booklet, NTG use, heart healthy diet and CRP2 reviewed. All questions and concerns addressed. Will refer to [facility] for CRP2. Will continue to follow.   [id number] Vaughn Asberry Hacking, RN BSN 01/01/2024 2:11 PM

## 2024-01-01 NOTE — Progress Notes (Signed)
 Per d/w Dr. Gasper Lloyd, updating round to CHF team given closed unit rounding. Team C made aware.

## 2024-01-01 NOTE — Progress Notes (Signed)
 Echocardiogram 2D Echocardiogram has been performed.  Sheila Marquez 01/01/2024, 9:52 AM

## 2024-01-01 NOTE — Discharge Instructions (Signed)

## 2024-01-01 NOTE — Progress Notes (Addendum)
 Advanced Heart Failure/Critical Care Rounding Note  Cardiologist: Dr. Mady Chief Complaint:  Subjective:    - Eating breakfast today - No longer having chest pain - Only complaint is pain in her right wrist.   Objective:   Weight Range: 68 kg Body mass index is 27.44 kg/m.   Vital Signs:   Temp:  [98.2 F (36.8 C)-99.1 F (37.3 C)] 98.6 F (37 C) (01/09 1014) Pulse Rate:  [68-100] 84 (01/09 1100) Resp:  [14-38] 14 (01/09 1100) BP: (89-151)/(46-95) 116/70 (01/09 1100) SpO2:  [90 %-100 %] 97 % (01/09 1100) Weight:  [68 kg] 68 kg (01/09 1014) Last BM Date :  (PTA)  Weight change: Filed Weights   01/01/24 1014  Weight: 68 kg    Intake/Output:   Intake/Output Summary (Last 24 hours) at 01/01/2024 1127 Last data filed at 01/01/2024 1100 Gross per 24 hour  Intake 400.59 ml  Output 1500 ml  Net -1099.41 ml      Physical Exam    General:  Well appearing. No resp difficulty; eating breakfast HEENT: Normal Neck: Supple. JVP 5. Carotids 2+ bilat; no bruits. No lymphadenopathy or thyromegaly appreciated. Cor: PMI nondisplaced. Regular rate & rhythm. +rub Lungs: Clear Abdomen: Soft, nontender, nondistended. No hepatosplenomegaly. No bruits or masses. Good bowel sounds. Extremities: RUE with small hematoma in the wrist; normal cap refill; pulses palpable.  Neuro: Alert & orientedx3, cranial nerves grossly intact. moves all 4 extremities w/o difficulty. Affect pleasant   Telemetry   NSR, 90s.   EKG    12/30/22: Sinus rhythm, anterior ST-T elevation with Q waves  Labs    CBC Recent Labs    12/31/23 0616 01/01/24 0257  WBC 12.2* 10.9*  HGB 16.4* 12.9  HCT 46.9* 37.8  MCV 91.4 91.3  PLT 245 188   Basic Metabolic Panel Recent Labs    98/91/74 0616 01/01/24 0257  NA 136 134*  K 4.2 3.6  CL 102 107  CO2 19* 19*  GLUCOSE 134* 121*  BUN 12 10  CREATININE 0.75 0.73  CALCIUM 10.0 8.7*    Thyroid  Function Tests Recent Labs    12/31/23 0616  TSH  0.963    Other results:   Imaging    CARDIAC CATHETERIZATION Result Date: 12/31/2023 Conclusions: Severe single-vessel coronary artery disease with occlusion of mid LAD.  Minimal luminal irregularities are noted in the dominant LCx. Moderately to severely reduced left ventricular systolic function (LVEF 30-35%) with mid/apical anterior and apical inferior hypokinesis/akinesis. Normal left ventricular filling pressure (LVEDP 7 mmHg). Successful IVUS-guided PCI to mid LAD using Onyx Frontier 2.25 x 30 mm drug-eluting stent (postdilated to 3.0 mm proximally) with 0% residual stenosis and TIMI-3 flow.  Mid LAD occlusion was challenging to cross and was complicated by wire perforation of a small branch (most likely a tiny diagonal).  Wire perforation controlled with balloon tamponade in the proximal LAD.  Small pericardial effusion noted at the time of perforation, unchanged case.  The patient remained hemodynamically stable throughout the procedure. Small and tortuous right radial artery, not suitable for catheterization.  Procedure performed via the right ulnar artery. Recommendations: Transfer to [facility] cardiac ICU for monitoring given wire perforation and small pericardial effusion.  If patient becomes hemodynamically unstable, STAT echocardiogram and consultation with interventional cardiology/cardiac surgery will need to be pursued.  Case discussed with interventional cardiology team at [facility]. Obtain echocardiogram tomorrow morning, sooner if patient becomes unstable. Dual antiplatelet therapy with aspirin  and ticagrelor  for at least 12 months.  Aggressive secondary prevention of CAD.  Consider initiation of PCSK9 inhibitor at discharge given history of statin intolerance. Sheila Hanson, MD [facility]    Medications:     Scheduled Medications:  aspirin   81 mg Oral Daily   Chlorhexidine  Gluconate Cloth  6 each Topical Daily   ezetimibe   10 mg Oral Daily   sodium chloride  flush  3 mL  Intravenous Q12H   ticagrelor   90 mg Oral BID    Infusions:  sodium chloride  20 mL/hr at 01/01/24 0800    PRN Medications: sodium chloride , acetaminophen , ondansetron  (ZOFRAN ) IV, sodium chloride  flush    Patient Profile   Sheila Marquez is a 73 yo WF w/ HTN, HLD that presented to [facility] on 12/[facility]23 with 12-24H of substernal chest pain associated with N/V/DF. EKG with ST-T elevation + Q waves in the anterior leads. She was initially medically managed; taken for LHC due to recurrent CP. LHC w/ occlusion of the mid LAD s/p PCI. Procedure complicated by small perforation of diagonal branch. Serial echos demonstrating stable small pericardial effusion. Transferred to [facility] for further monitoring.   Assessment/Plan   NSTEMI / Late presenting STEMI s/p PCI to the LAD - 12-24H of chest pain prior to presentation  - S/P PCI to the mid LAD with TIMI III flow distally.  - ASA 81mg  / Ticagrelor  for 12 months.  - Statin intolerant; continue ezetimibe .  - Plan to start PCSK9i at discharge; discussed with pharmD.  - Lipid panel pending. LDL 66 on 04/11/23.   2. Coronary perforation with pericardial effusion - TTE this morning with no signs of tamponade; IVC collapsible. On exam, hemodynamically stable with JVP 5-6cm.  - Hgb 16.4 to 12.9; I do not suspect this is secondary to blood loss. Will repeat in 6h.  - Will transfer to floor today.   3. 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 Anterior wall does not appear thinned out or akinetic on TTE, however, Q waves on EKG.  - Start toprol  12.5mg  daily.  - SBP low 100s; will hold off on ARB/ACE for the time being.  - Start spironolactone  12.5mg  daily.   4. Hypertension - Hypotensive currently - Will hold off on anti-hypertensives  5. Right arm hematoma - Small hematoma at right wrist; improving. Good distal perfusion; normal cap refill. Pulses palpable. Will monitor.   Length of Stay: 1  Mio Schellinger 11:43 AM  Advanced Heart Failure Team Pager [phone] (M-F; 7a - 5p)  Please contact [facility] for night-coverage after hours (5p -7a ) and weekends on [email or web address]

## 2024-01-02 ENCOUNTER — Other Ambulatory Visit (HOSPITAL_COMMUNITY): Payer: Self-pay

## 2024-01-02 ENCOUNTER — Telehealth (HOSPITAL_COMMUNITY): Payer: Self-pay | Admitting: Pharmacy Technician

## 2024-01-02 DIAGNOSIS — I214 Non-ST elevation (NSTEMI) myocardial infarction: Secondary | ICD-10-CM | POA: Diagnosis not present

## 2024-01-02 LAB — BASIC METABOLIC PANEL
Anion gap: 8 (ref 5–15)
BUN: 15 mg/dL (ref 8–23)
CO2: 23 mmol/L (ref 22–32)
Calcium: 9.3 mg/dL (ref 8.9–10.3)
Chloride: 105 mmol/L (ref 98–111)
Creatinine, Ser: 0.72 mg/dL (ref 0.44–1.00)
GFR, Estimated: >60 mL/min (ref >60)
Glucose, Bld: 107 mg/dL — ABNORMAL HIGH (ref 70–99)
Potassium: 4 mmol/L (ref 3.5–5.1)
Sodium: 136 mmol/L (ref 135–145)

## 2024-01-02 LAB — CBC
HCT: 36.9 % (ref 36.0–46.0)
Hemoglobin: 12.7 g/dL (ref 12.0–15.0)
MCH: 32.1 pg (ref 26.0–34.0)
MCHC: 34.4 g/dL (ref 30.0–36.0)
MCV: 93.2 fL (ref 80.0–100.0)
Platelets: 179 10*3/uL (ref 150–400)
RBC: 3.96 MIL/uL (ref 3.87–5.11)
RDW: 13.7 % (ref 11.5–15.5)
WBC: 8.4 10*3/uL (ref 4.0–10.5)
nRBC: 0 % (ref 0.0–0.2)

## 2024-01-02 LAB — LIPID PANEL
Cholesterol: 163 mg/dL (ref 0–200)
HDL: 80 mg/dL (ref >40)
LDL Cholesterol: 73 mg/dL (ref 0–99)
Total CHOL/HDL Ratio: 2 {ratio}
Triglycerides: 51 mg/dL (ref <150)
VLDL: 10 mg/dL (ref 0–40)

## 2024-01-02 LAB — LIPOPROTEIN A (LPA): Lipoprotein (a): <8.4 nmol/L (ref <75.0)

## 2024-01-02 MED ORDER — SPIRONOLACTONE 25 MG PO TABS
12.5000 mg | ORAL_TABLET | Freq: Every day | ORAL | 5 refills | Status: DC
  Filled 2024-01-02: qty 15, 30d supply, fill #0

## 2024-01-02 MED ORDER — ASPIRIN 81 MG PO CHEW
81.0000 mg | CHEWABLE_TABLET | Freq: Every day | ORAL | 12 refills | Status: AC
  Filled 2024-01-02: qty 30, 30d supply, fill #0

## 2024-01-02 MED ORDER — TICAGRELOR 90 MG PO TABS
90.0000 mg | ORAL_TABLET | Freq: Two times a day (BID) | ORAL | 5 refills | Status: DC
  Filled 2024-01-02: qty 60, 30d supply, fill #0

## 2024-01-02 MED ORDER — LOSARTAN POTASSIUM 25 MG PO TABS
12.5000 mg | ORAL_TABLET | Freq: Every day | ORAL | 5 refills | Status: DC
  Filled 2024-01-02: qty 15, 30d supply, fill #0

## 2024-01-02 MED ORDER — LOSARTAN POTASSIUM 25 MG PO TABS
12.5000 mg | ORAL_TABLET | Freq: Every day | ORAL | Status: DC
Start: 2024-01-02 — End: 2024-01-02
  Administered 2024-01-02: 12.5 mg via ORAL
  Filled 2024-01-02: qty 1

## 2024-01-02 MED ORDER — METOPROLOL SUCCINATE ER 25 MG PO TB24
12.5000 mg | ORAL_TABLET | Freq: Every day | ORAL | 5 refills | Status: DC
  Filled 2024-01-02: qty 15, 30d supply, fill #0

## 2024-01-02 NOTE — Telephone Encounter (Signed)
 Pharmacy Patient Advocate Encounter   Received notification  that prior authorization for Repatha  140MG /ML syringesis required/requested.   Insurance verification completed.   The patient is insured through [facility] [employer]/RX ADVANCE .   Per test claim: PA required; PA submitted to above mentioned insurance via CoverMyMeds Key/confirmation #/EOC [id number] Status is pending

## 2024-01-02 NOTE — TOC Initial Note (Signed)
 Transition of Care [facility]) - Initial/Assessment Note    Patient Details  Name: Sheila Marquez MRN: [id number] Date of Birth: [birth year]  Transition of Care [facility]) CM/SW Contact:    Justina Delcia Czar, RN Phone Number:[phone] 01/02/2024, 1:30 PM  Clinical Narrative:                  CM spoke to pt at bedside. States she lives independently at home. Her daughter will provide transportation home.  She drives to her appts.  Attempted call to PCP office to arrange appt. Left message for return call. Faxed dc summary requesting office schedule hospital follow up.    Expected Discharge Plan: Home/Self Care Barriers to Discharge: No Barriers Identified   Patient Goals and CMS Choice Patient states their goals for this hospitalization and ongoing recovery are:: wants to recover          Expected Discharge Plan and Services   Discharge Planning Services: CM Consult   Living arrangements for the past 2 months: Single Family Home Expected Discharge Date: 01/02/24                                    Prior Living Arrangements/Services Living arrangements for the past 2 months: Single Family Home Lives with:: Self Patient language and need for interpreter reviewed:: Yes Do you feel safe going back to the place where you live?: Yes      Need for Family Participation in Patient Care: No (Comment) Care giver support system in place?: Yes (comment)   Criminal Activity/Legal Involvement Pertinent to Current Situation/Hospitalization: No - Comment as needed  Activities of Daily Living   ADL Screening (condition at time of admission) Independently performs ADLs?: Yes (appropriate for developmental age) Is the patient deaf or have difficulty hearing?: No Does the patient have difficulty seeing, even when wearing glasses/contacts?: No Does the patient have difficulty concentrating, remembering, or making decisions?: No  Permission Sought/Granted Permission sought to  share information with : Case Manager, Family Supports, PCP Permission granted to share information with : Yes, Verbal Permission Granted  Share Information with NAME: Charmaine Stamps     Permission granted to share info w Relationship: daughter  Permission granted to share info w Contact Information: [phone]  Emotional Assessment Appearance:: Appears stated age Attitude/Demeanor/Rapport: Engaged Affect (typically observed): Accepting Orientation: : Oriented to Self, Oriented to Place, Oriented to  Time, Oriented to Situation   Psych Involvement: No (comment)  Admission diagnosis:  Non-ST elevation (NSTEMI) myocardial infarction [facility]) [I21.4] Patient Active Problem List   Diagnosis Date Noted   NSTEMI (non-ST elevated myocardial infarction) (HCC) 12/31/2023   HLD (hyperlipidemia) 12/31/2023   HTN (hypertension) 12/31/2023   Non-ST elevation (NSTEMI) myocardial infarction (HCC) 12/31/2023   PCP:  Sherial Bail, MD Pharmacy:   [facility] [id number], [place] - [address] [place] [address] Phone: [phone] Fax: [phone]     Social Drivers of Health (SDOH) Social History: SDOH Screenings   Food Insecurity: No Food Insecurity (01/01/2024)  Housing: Low Risk  (01/01/2024)  Transportation Needs: No Transportation Needs (01/01/2024)  Utilities: Not At Risk (01/01/2024)  Financial Resource Strain: Low Risk  (10/20/2023)   Received from [facility]  Social Connections: Moderately Integrated (01/01/2024)  Tobacco Use: Low Risk  (01/01/2024)   SDOH Interventions:     Readmission Risk Interventions     No  data to display

## 2024-01-02 NOTE — Plan of Care (Signed)
  Problem: Activity: Goal: Ability to return to baseline activity level will improve Outcome: Adequate for Discharge   Problem: Cardiovascular: Goal: Ability to achieve and maintain adequate cardiovascular perfusion will improve Outcome: Adequate for Discharge   Problem: Cardiovascular: Goal: Vascular access site(s) Level 0-1 will be maintained Outcome: Adequate for Discharge   Problem: Health Behavior/Discharge Planning: Goal: Ability to safely manage health-related needs after discharge will improve Outcome: Adequate for Discharge

## 2024-01-02 NOTE — Discharge Summary (Addendum)
 Advanced Heart Failure Team  Discharge Summary   Patient ID: Sheila Marquez MRN: [id number], DOB/AGE: 1951/05/31 73 y.o. Admit date: 12/31/2023 D/C date:     01/02/2024   Primary Discharge Diagnoses:  NSTEMI Coronary perforation HFrEF Ischemic cardiomyopathy  Secondary Discharge Diagnoses:  HTN Right arm hematoma  Hospital Course:   73 y.o. female with history  of HTN and HLD intolerant to statins. Admitted to [facility] on 12/31/23 with NSTEMI/late presenting STEMI.  EKG with ST-T elevation + Q waves in the anterior leads. She was initially medically managed; taken for LHC due to recurrent CP s/p PCI to LAD. Procedure complicated by small perforation to diagonal branch. She was transferred to [facility] for monitoring. Serial echos with small pericardial effusion, no signs of tamponade. She was started on DAPT and referred for outpatient initiation of PCSK9i d/t statin intolerance. TTE with EF 45-50%, WMA in LAD territory, RV okay.  GDMT titrated but limited by soft BP. Developed R arm hematoma but had evidence of good perfusion.   Medications sent to [facility] pharmacy at discharge.  Follow-up with Dr. Mady at [facility] arranged.  Hospital Course by Problem: NSTEMI / Late presenting STEMI s/p PCI to the LAD - 12-24H of chest pain prior to presentation  - S/P PCI to the mid LAD with TIMI III flow distally.  - ASA 81mg  / Ticagrelor  for 12 months.  - No recurrent angina - Statin intolerant; continue ezetimibe .  - Referred for initiation of PCSK9i at discharge; discussed with pharmD.  - LDL 73 on 01/02/24   2. Coronary perforation with pericardial effusion - TTE 01/01/24 with no signs of tamponade; IVC collapsible. She has been hemodynamically stable. - Hgb 16.4 to 12.9>12.7; do not suspect this is secondary to blood loss.    3. 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 Anterior wall does not appear thinned out or akinetic on TTE, however, Q waves on EKG.   - Continue spiro 12.5 mg daily - Continue metoprolol  xl 12.5 mg daily - Start losartan  12.5 mg daily   4. Hypertension - BP soft - Meds as above   5. Right arm hematoma - Small hematoma at right wrist; improving. Good distal perfusion; Pulses palpable.  - Discussed monitoring at home  Discharge Vitals: Blood pressure (!) 109/56, pulse 77, temperature 99 F (37.2 C), temperature source Oral, resp. rate 20, height 5' 2 (1.575 m), weight 68 kg, SpO2 96%.  Labs: Lab Results  Component Value Date   WBC 8.4 01/02/2024   HGB 12.7 01/02/2024   HCT 36.9 01/02/2024   MCV 93.2 01/02/2024   PLT 179 01/02/2024    Recent Labs  Lab 01/02/24 0222  NA 136  K 4.0  CL 105  CO2 23  BUN 15  CREATININE 0.72  CALCIUM 9.3  GLUCOSE 107*   Lab Results  Component Value Date   CHOL 163 01/02/2024   HDL 80 01/02/2024   LDLCALC 73 01/02/2024   TRIG 51 01/02/2024   BNP (last 3 results) No results for input(s): BNP in the last 8760 hours.  ProBNP (last 3 results) No results for input(s): PROBNP in the last 8760 hours.   Diagnostic Studies/Procedures   ECHOCARDIOGRAM COMPLETE Result Date: 01/01/2024    ECHOCARDIOGRAM REPORT   Patient Name:   Sheila Marquez Date of Exam: 01/01/2024 Medical Rec #:  [id number]         Height:       62.0 in Accession #:  [id number]        Weight:       150.0 lb Date of Birth:  05-23-1951         BSA:          1.692 m Patient Age:    72 years          BP:           112/96 mmHg Patient Gender: F                 HR:           86 bpm. Exam Location:  Inpatient Procedure: 2D Echo, Cardiac Doppler and Color Doppler Indications:    NSTEMI I21.4  History:        Patient has no prior history of Echocardiogram examinations.                 Previous Myocardial Infarction; Risk Factors:Hypertension and                 Dyslipidemia.  Sonographer:    Thea Norlander RCS Referring Phys: [id number] XIKA ZHAO IMPRESSIONS  1. Left ventricular ejection fraction, by estimation, is  45 to 50%. Left ventricular ejection fraction by 2D MOD biplane is 48.7 %. The left ventricle has mildly decreased function. The left ventricle demonstrates regional wall motion abnormalities (see  scoring diagram/findings for description). There is moderate asymmetric left ventricular hypertrophy of the basal-septal segment. Left ventricular diastolic parameters are consistent with Grade I diastolic dysfunction (impaired relaxation). There is severe akinesis of the left ventricular, mid-apical anteroseptal wall, anterior wall and apical segment.  2. Right ventricular systolic function is normal. The right ventricular size is normal. Tricuspid regurgitation signal is inadequate for assessing PA pressure.  3. A small pericardial effusion is present. The pericardial effusion is localized near the right ventricle. There is no evidence of cardiac tamponade.  4. The mitral valve is abnormal. Trivial mitral valve regurgitation.  5. The aortic valve is tricuspid. Aortic valve regurgitation is not visualized. Aortic valve sclerosis is present, with no evidence of aortic valve stenosis.  6. The inferior vena cava is normal in size with <50% respiratory variability, suggesting right atrial pressure of 8 mmHg. Comparison(s): No prior Echocardiogram. Conclusion(s)/Recommendation(s): Findings suggest LAD territory infarct, probable at the mid-vessel portion. FINDINGS  Left Ventricle: Left ventricular ejection fraction, by estimation, is 45 to 50%. Left ventricular ejection fraction by 2D MOD biplane is 48.7 %. The left ventricle has mildly decreased function. The left ventricle demonstrates regional wall motion abnormalities. Severe akinesis of the left ventricular, mid-apical anteroseptal wall, anterior wall and apical segment. The left ventricular internal cavity size was normal in size. There is moderate asymmetric left ventricular hypertrophy of the basal-septal segment. Left ventricular diastolic parameters are consistent  with Grade I diastolic dysfunction (impaired relaxation). Indeterminate filling pressures.  LV Wall Scoring: The mid and distal anterior septum and entire apex are akinetic. Right Ventricle: The right ventricular size is normal. No increase in right ventricular wall thickness. Right ventricular systolic function is normal. Tricuspid regurgitation signal is inadequate for assessing PA pressure. Left Atrium: Left atrial size was normal in size. Right Atrium: Right atrial size was normal in size. Pericardium: A small pericardial effusion is present. The pericardial effusion is localized near the right ventricle. There is diastolic collapse of the right atrial wall and diastolic collapse of the right ventricular free wall. There is no evidence of cardiac tamponade. Mitral Valve: The mitral valve is abnormal. Mild mitral  annular calcification. Trivial mitral valve regurgitation. Tricuspid Valve: The tricuspid valve is grossly normal. Tricuspid valve regurgitation is trivial. Aortic Valve: The aortic valve is tricuspid. Aortic valve regurgitation is not visualized. Aortic valve sclerosis is present, with no evidence of aortic valve stenosis. Aortic valve peak gradient measures 8.1 mmHg. Pulmonic Valve: The pulmonic valve was normal in structure. Pulmonic valve regurgitation is not visualized. Aorta: The aortic root and ascending aorta are structurally normal, with no evidence of dilitation. Venous: The inferior vena cava is normal in size with less than 50% respiratory variability, suggesting right atrial pressure of 8 mmHg. IAS/Shunts: No atrial level shunt detected by color flow Doppler.  LEFT VENTRICLE PLAX 2D                        Biplane EF (MOD) LVIDd:         3.90 cm         LV Biplane EF:   Left LVIDs:         2.50 cm                          ventricular LV PW:         0.80 cm                          ejection LV IVS:        1.20 cm                          fraction by LVOT diam:     2.00 cm                           2D MOD LV SV:         47                               biplane is LV SV Index:   28                               48.7 %. LVOT Area:     3.14 cm                                Diastology                                LV e' medial:    5.87 cm/s LV Volumes (MOD)               LV E/e' medial:  9.3 LV vol d, MOD    60.3 ml       LV e' lateral:   2.94 cm/s A2C:                           LV E/e' lateral: 18.6 LV vol d, MOD    90.0 ml A4C: LV vol s, MOD    25.5 ml A2C: LV vol s, MOD    53.9 ml A4C: LV SV MOD A2C:   34.8 ml LV SV MOD A4C:   90.0 ml LV  SV MOD BP:    36.2 ml RIGHT VENTRICLE             IVC RV S prime:     14.80 cm/s  IVC diam: 1.90 cm TAPSE (M-mode): 2.6 cm LEFT ATRIUM             Index        RIGHT ATRIUM           Index LA diam:        2.90 cm 1.71 cm/m   RA Area:     10.41 cm LA Vol (A2C):   29.1 ml 17.17 ml/m  RA Volume:   20.00 ml  11.82 ml/m LA Vol (A4C):   40.3 ml 23.82 ml/m LA Biplane Vol: 36.7 ml 21.69 ml/m  AORTIC VALVE AV Area (Vmax): 2.26 cm AV Vmax:        142.00 cm/s AV Peak Grad:   8.1 mmHg LVOT Vmax:      102.07 cm/s LVOT Vmean:     62.433 cm/s LVOT VTI:       0.151 m  AORTA Ao Root diam: 3.00 cm Ao Asc diam:  2.90 cm MITRAL VALVE MV Area (PHT): 4.39 cm     SHUNTS MV Decel Time: 173 msec     Systemic VTI:  0.15 m MV E velocity: 54.60 cm/s   Systemic Diam: 2.00 cm MV A velocity: 103.00 cm/s MV E/A ratio:  0.53 Vinie Maxcy MD Electronically signed by Vinie Maxcy MD Signature Date/Time: 01/01/2024/1:16:49 PM    Final    CARDIAC CATHETERIZATION Result Date: 12/31/2023 Conclusions: Severe single-vessel coronary artery disease with occlusion of mid LAD.  Minimal luminal irregularities are noted in the dominant LCx. Moderately to severely reduced left ventricular systolic function (LVEF 30-35%) with mid/apical anterior and apical inferior hypokinesis/akinesis. Normal left ventricular filling pressure (LVEDP 7 mmHg). Successful IVUS-guided PCI to mid LAD using Onyx Frontier 2.25 x 30  mm drug-eluting stent (postdilated to 3.0 mm proximally) with 0% residual stenosis and TIMI-3 flow.  Mid LAD occlusion was challenging to cross and was complicated by wire perforation of a small branch (most likely a tiny diagonal).  Wire perforation controlled with balloon tamponade in the proximal LAD.  Small pericardial effusion noted at the time of perforation, unchanged case.  The patient remained hemodynamically stable throughout the procedure. Small and tortuous right radial artery, not suitable for catheterization.  Procedure performed via the right ulnar artery. Recommendations: Transfer to [facility] cardiac ICU for monitoring given wire perforation and small pericardial effusion.  If patient becomes hemodynamically unstable, STAT echocardiogram and consultation with interventional cardiology/cardiac surgery will need to be pursued.  Case discussed with interventional cardiology team at [facility]. Obtain echocardiogram tomorrow morning, sooner if patient becomes unstable. Dual antiplatelet therapy with aspirin  and ticagrelor  for at least 12 months. Aggressive secondary prevention of CAD.  Consider initiation of PCSK9 inhibitor at discharge given history of statin intolerance. Lonni Hanson, MD [facility]   Discharge Medications   Allergies as of 01/02/2024       Reactions   Statins Other (See Comments)   Myalgia   Hydrocodone Nausea And Vomiting   Propoxyphene Nausea And Vomiting        Medication List     STOP taking these medications    amLODipine 2.5 MG tablet Commonly known as: NORVASC   Cinnamon 500 MG capsule   TURMERIC CURCUMIN PO       TAKE these medications    acetaminophen  500 MG  tablet Commonly known as: TYLENOL  Take 500 mg by mouth every 6 (six) hours as needed for mild pain (pain score 1-3) or fever.   Aspirin  Low Dose 81 MG chewable tablet Generic drug: aspirin  Chew 1 tablet (81 mg total) by mouth daily. Start taking on: January 03, 2024    Brilinta  90 MG Tabs tablet Generic drug: ticagrelor  Take 1 tablet (90 mg total) by mouth 2 (two) times daily.   calcium carbonate 1500 (600 Ca) MG Tabs tablet Commonly known as: OSCAL Take 600 mg of elemental calcium by mouth 2 (two) times daily with a meal.   cetirizine 10 MG tablet Commonly known as: ZYRTEC Take 10 mg by mouth daily.   diphenhydrAMINE 25 MG tablet Commonly known as: BENADRYL Take 25 mg by mouth every 6 (six) hours as needed.   docusate sodium 50 MG capsule Commonly known as: COLACE Take 50 mg by mouth 2 (two) times daily as needed for mild constipation.   ezetimibe  10 MG tablet Commonly known as: ZETIA  Take 1 tablet by mouth daily.   glucosamine-chondroitin 500-400 MG tablet Take 1 tablet by mouth 2 (two) times daily.   losartan  25 MG tablet Commonly known as: COZAAR  Take 0.5 tablets (12.5 mg total) by mouth daily. Start taking on: January 03, 2024   magnesium oxide 400 MG tablet Commonly known as: MAG-OX Take 400 mg by mouth daily.   metoprolol  succinate 25 MG 24 hr tablet Commonly known as: TOPROL -XL Take 0.5 tablets (12.5 mg total) by mouth daily. Start taking on: January 03, 2024   Multi-Vitamin tablet Take 1 tablet by mouth daily.   psyllium 58.6 % packet Commonly known as: METAMUCIL Take 1 packet by mouth 2 (two) times daily.   spironolactone  25 MG tablet Commonly known as: ALDACTONE  Take 0.5 tablets (12.5 mg total) by mouth daily. Start taking on: January 03, 2024   zinc gluconate 50 MG tablet Take 50 mg by mouth daily.        Disposition   The patient will be discharged in stable condition to home. Discharge Instructions     AMB Referral to Advanced Lipid Disorders Clinic   Complete by: As directed    NSTEMI with statin intolerance, LDL 73 on Zetia .  PA submitted 1/10 - in progress (#/EOC [id number])   Reason for referral: PCSK9 inhibitor education and prior authorization approvals   Referred to: PharmD   Internal Lipid  Clinic Referral Scheduling  Internal lipid clinic referrals are providers within [facility], who wish to refer established patients for routine management (help in starting PCSK9 inhibitor therapy) or advanced therapies.  Internal MD referral criteria:              1. All patients with LDL>190 mg/dL  2. All patients with Triglycerides >500 mg/dL  3. Patients with suspected or confirmed heterozygous familial hyperlipidemia (HeFH) or homozygous familial hyperlipidemia (HoFH)  4. Patients with family history of suspicious for genetic dyslipidemia desiring genetic testing  5. Patients refractory to standard guideline based therapy  6. Patients with statin intolerance (failed 2 statins, one of which must be a high potency statin)  7. Patients who the provider desires to be seen by MD   Internal PharmD referral criteria:   1. Follow-up patients for medication management  2. Follow-up for compliance monitoring  3. Patients for drug education  4. Patients with statin intolerance  5. PCSK9 inhibitor education and prior authorization approvals  6. Patients with triglycerides <500 mg/dL  External Lipid Clinic Referral  External  lipid clinic referrals are for providers outside of [facility], considered new clinic patients - automatically routed to MD schedule   Amb Referral to Cardiac Rehabilitation   Complete by: As directed    Diagnosis:  NSTEMI Coronary Stents     After initial evaluation and assessments completed: Virtual Based Care may be provided alone or in conjunction with Phase 2 Cardiac Rehab based on patient barriers.: Yes   Intensive Cardiac Rehabilitation (ICR) [facility] location only OR Traditional Cardiac Rehabilitation (TCR) *If criteria for ICR are not met will enroll in TCR [facility] only): Yes   Diet - low sodium heart healthy   Complete by: As directed    Heart Failure patients record your daily weight using the same scale at the same time of day   Complete by: As directed     Increase activity slowly   Complete by: As directed    STOP any activity that causes chest pain, shortness of breath, dizziness, sweating, or exessive weakness   Complete by: As directed        Follow-up Information     Abigail Bernardino HERO, PA-C Follow up on 01/13/2024.   Specialties: Cardiology, Radiology Why: Appointment at 8:25 AM Contact information: [address] [place] [address] [phone]                   Duration of Discharge Encounter: Greater than 35 minutes   Signed, Serenidy Waltz N  01/02/2024, 12:01 PM

## 2024-01-02 NOTE — Telephone Encounter (Signed)
 Patient Product/process Development Scientist completed.    The patient is insured through [employer]/ [employer] Advance. Patient has Medicare and is not eligible for a copay card, but may be able to apply for patient assistance or Medicare RX Payment Plan (Patient Must reach out to their plan, if eligible for payment plan), if available.    Ran test claim for Repatha  Sureclick 140 mg/ ml and Requires Prior Authorization.  Ran test claim for Praluent 150 mg/ ml and Requires Prior Authorization.  This test claim was processed through [facility]- copay amounts may vary at other pharmacies due to pharmacy/plan contracts, or as the patient moves through the different stages of their insurance plan.     Reyes Sharps, CPHT Pharmacy Technician III Certified Patient Advocate [facility] Patient Advocate Team Direct Number: [phone]  Fax: [phone]

## 2024-01-02 NOTE — Progress Notes (Signed)
 Advanced Heart Failure/Critical Care Rounding Note  Cardiologist: Dr. Mady  Chief Complaint: NSTEMI   Subjective:   No dyspnea or chest pain. Sitting up in bed. Walked around the unit yesterday.  Wondering when she can go home.   Objective:   Weight Range: 68 kg Body mass index is 27.44 kg/m.   Vital Signs:   Temp:  [97.7 F (36.5 C)-98.6 F (37 C)] 98.3 F (36.8 C) (01/09 1600) Pulse Rate:  [68-89] 69 (01/10 0600) Resp:  [14-29] 20 (01/10 0600) BP: (91-118)/(46-70) 102/48 (01/10 0600) SpO2:  [94 %-97 %] 94 % (01/10 0600) Weight:  [68 kg] 68 kg (01/09 1014) Last BM Date :  (PTA)  Weight change: Filed Weights   01/01/24 1014  Weight: 68 kg    Intake/Output:   Intake/Output Summary (Last 24 hours) at 01/02/2024 0718 Last data filed at 01/02/2024 0400 Gross per 24 hour  Intake 166.75 ml  Output 1950 ml  Net -1783.25 ml      Physical Exam    General:  Sitting up in bed. No distress. HEENT: normal Neck: supple. no JVD.  Cor: PMI nondisplaced. Regular rate & rhythm. No rubs, gallops or murmurs. Lungs: clear Abdomen: soft, nontender, nondistended.  Extremities: no cyanosis, clubbing, rash, edema, hematoma R wrist; palpable pulse Neuro: alert & oriented x 3. Affect pleasant  Telemetry   SR 70s  Labs    CBC Recent Labs    01/01/24 0257 01/02/24 0222  WBC 10.9* 8.4  HGB 12.9 12.7  HCT 37.8 36.9  MCV 91.3 93.2  PLT 188 179   Basic Metabolic Panel Recent Labs    98/90/74 0257 01/02/24 0222  NA 134* 136  K 3.6 4.0  CL 107 105  CO2 19* 23  GLUCOSE 121* 107*  BUN 10 15  CREATININE 0.73 0.72  CALCIUM 8.7* 9.3    Thyroid  Function Tests Recent Labs    12/31/23 0616  TSH 0.963    Other results:   Imaging    ECHOCARDIOGRAM COMPLETE Result Date: 01/01/2024    ECHOCARDIOGRAM REPORT   Patient Name:   Sheila Marquez Date of Exam: 01/01/2024 Medical Rec #:  [id number]         Height:       62.0 in Accession #:    [id number]         Weight:       150.0 lb Date of Birth:  19-Aug-1951         BSA:          1.692 m Patient Age:    72 years          BP:           112/96 mmHg Patient Gender: F                 HR:           86 bpm. Exam Location:  Inpatient Procedure: 2D Echo, Cardiac Doppler and Color Doppler Indications:    NSTEMI I21.4  History:        Patient has no prior history of Echocardiogram examinations.                 Previous Myocardial Infarction; Risk Factors:Hypertension and                 Dyslipidemia.  Sonographer:    Thea Norlander RCS Referring Phys: [id number] XIKA ZHAO IMPRESSIONS  1. Left ventricular ejection fraction, by estimation, is 45 to 50%. Left  ventricular ejection fraction by 2D MOD biplane is 48.7 %. The left ventricle has mildly decreased function. The left ventricle demonstrates regional wall motion abnormalities (see  scoring diagram/findings for description). There is moderate asymmetric left ventricular hypertrophy of the basal-septal segment. Left ventricular diastolic parameters are consistent with Grade I diastolic dysfunction (impaired relaxation). There is severe akinesis of the left ventricular, mid-apical anteroseptal wall, anterior wall and apical segment.  2. Right ventricular systolic function is normal. The right ventricular size is normal. Tricuspid regurgitation signal is inadequate for assessing PA pressure.  3. A small pericardial effusion is present. The pericardial effusion is localized near the right ventricle. There is no evidence of cardiac tamponade.  4. The mitral valve is abnormal. Trivial mitral valve regurgitation.  5. The aortic valve is tricuspid. Aortic valve regurgitation is not visualized. Aortic valve sclerosis is present, with no evidence of aortic valve stenosis.  6. The inferior vena cava is normal in size with <50% respiratory variability, suggesting right atrial pressure of 8 mmHg. Comparison(s): No prior Echocardiogram. Conclusion(s)/Recommendation(s): Findings suggest LAD  territory infarct, probable at the mid-vessel portion. FINDINGS  Left Ventricle: Left ventricular ejection fraction, by estimation, is 45 to 50%. Left ventricular ejection fraction by 2D MOD biplane is 48.7 %. The left ventricle has mildly decreased function. The left ventricle demonstrates regional wall motion abnormalities. Severe akinesis of the left ventricular, mid-apical anteroseptal wall, anterior wall and apical segment. The left ventricular internal cavity size was normal in size. There is moderate asymmetric left ventricular hypertrophy of the basal-septal segment. Left ventricular diastolic parameters are consistent with Grade I diastolic dysfunction (impaired relaxation). Indeterminate filling pressures.  LV Wall Scoring: The mid and distal anterior septum and entire apex are akinetic. Right Ventricle: The right ventricular size is normal. No increase in right ventricular wall thickness. Right ventricular systolic function is normal. Tricuspid regurgitation signal is inadequate for assessing PA pressure. Left Atrium: Left atrial size was normal in size. Right Atrium: Right atrial size was normal in size. Pericardium: A small pericardial effusion is present. The pericardial effusion is localized near the right ventricle. There is diastolic collapse of the right atrial wall and diastolic collapse of the right ventricular free wall. There is no evidence of cardiac tamponade. Mitral Valve: The mitral valve is abnormal. Mild mitral annular calcification. Trivial mitral valve regurgitation. Tricuspid Valve: The tricuspid valve is grossly normal. Tricuspid valve regurgitation is trivial. Aortic Valve: The aortic valve is tricuspid. Aortic valve regurgitation is not visualized. Aortic valve sclerosis is present, with no evidence of aortic valve stenosis. Aortic valve peak gradient measures 8.1 mmHg. Pulmonic Valve: The pulmonic valve was normal in structure. Pulmonic valve regurgitation is not visualized. Aorta:  The aortic root and ascending aorta are structurally normal, with no evidence of dilitation. Venous: The inferior vena cava is normal in size with less than 50% respiratory variability, suggesting right atrial pressure of 8 mmHg. IAS/Shunts: No atrial level shunt detected by color flow Doppler.  LEFT VENTRICLE PLAX 2D                        Biplane EF (MOD) LVIDd:         3.90 cm         LV Biplane EF:   Left LVIDs:         2.50 cm  ventricular LV PW:         0.80 cm                          ejection LV IVS:        1.20 cm                          fraction by LVOT diam:     2.00 cm                          2D MOD LV SV:         47                               biplane is LV SV Index:   28                               48.7 %. LVOT Area:     3.14 cm                                Diastology                                LV e' medial:    5.87 cm/s LV Volumes (MOD)               LV E/e' medial:  9.3 LV vol d, MOD    60.3 ml       LV e' lateral:   2.94 cm/s A2C:                           LV E/e' lateral: 18.6 LV vol d, MOD    90.0 ml A4C: LV vol s, MOD    25.5 ml A2C: LV vol s, MOD    53.9 ml A4C: LV SV MOD A2C:   34.8 ml LV SV MOD A4C:   90.0 ml LV SV MOD BP:    36.2 ml RIGHT VENTRICLE             IVC RV S prime:     14.80 cm/s  IVC diam: 1.90 cm TAPSE (M-mode): 2.6 cm LEFT ATRIUM             Index        RIGHT ATRIUM           Index LA diam:        2.90 cm 1.71 cm/m   RA Area:     10.41 cm LA Vol (A2C):   29.1 ml 17.17 ml/m  RA Volume:   20.00 ml  11.82 ml/m LA Vol (A4C):   40.3 ml 23.82 ml/m LA Biplane Vol: 36.7 ml 21.69 ml/m  AORTIC VALVE AV Area (Vmax): 2.26 cm AV Vmax:        142.00 cm/s AV Peak Grad:   8.1 mmHg LVOT Vmax:      102.07 cm/s LVOT Vmean:     62.433 cm/s LVOT VTI:       0.151 m  AORTA Ao Root diam: 3.00 cm Ao Asc diam:  2.90 cm MITRAL VALVE MV Area (PHT): 4.39  cm     SHUNTS MV Decel Time: 173 msec     Systemic VTI:  0.15 m MV E velocity: 54.60 cm/s   Systemic Diam:  2.00 cm MV A velocity: 103.00 cm/s MV E/A ratio:  0.53 Vinie Maxcy MD Electronically signed by Vinie Maxcy MD Signature Date/Time: 01/01/2024/1:16:49 PM    Final      Medications:     Scheduled Medications:  aspirin   81 mg Oral Daily   Chlorhexidine  Gluconate Cloth  6 each Topical Daily   ezetimibe   10 mg Oral Daily   metoprolol  succinate  12.5 mg Oral Daily   sodium chloride  flush  3 mL Intravenous Q12H   spironolactone   12.5 mg Oral Daily   ticagrelor   90 mg Oral BID    Infusions:    PRN Medications: acetaminophen , ondansetron  (ZOFRAN ) IV, sodium chloride  flush    Patient Profile   Sheila Marquez is a 73 yo WF w/ HTN, HLD that presented to [facility] on 12/31/23 with 12-24H of substernal chest pain associated with N/V/DF. EKG with ST-T elevation + Q waves in the anterior leads. She was initially medically managed; taken for LHC due to recurrent CP. LHC w/ occlusion of the mid LAD s/p PCI. Procedure complicated by small perforation of diagonal branch. Serial echos demonstrating stable small pericardial effusion. Transferred to [facility] for further monitoring.   Assessment/Plan   NSTEMI / Late presenting STEMI s/p PCI to the LAD - 12-24H of chest pain prior to presentation  - S/P PCI to the mid LAD with TIMI III flow distally.  - ASA 81mg  / Ticagrelor  for 12 months.  - No recurrent angina - Statin intolerant; continue ezetimibe .  - Plan to refer for initiation of PCSK9i at discharge; discussed with pharmD.  - LDL 73 on 01/02/24  2. Coronary perforation with pericardial effusion - TTE 01/01/24 with no signs of tamponade; IVC collapsible. She has been hemodynamically stable. - Hgb 16.4 to 12.9>12.7; do not suspect this is secondary to blood loss.   3. 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 Anterior wall does not appear thinned out or akinetic on TTE, however, Q waves on EKG.  - Continue spiro 12.5 mg daily - Continue metoprolol  xl  12.5 mg daily - Start losartan  12.5 mg daily  4. Hypertension - BP soft - Meds as above  5. Right arm hematoma - Small hematoma at right wrist; improving. Good distal perfusion; Pulses palpable.  - Discussed monitoring at home  Length of Stay: 2  Sheila Dutch, PA-C 7:18 AM  Advanced Heart Failure Team Pager [phone] (M-F; 7a - 5p)  Please contact [facility] for night-coverage after hours (5p -7a ) and weekends on [email or web address]

## 2024-01-02 NOTE — Consult Note (Signed)
 [facility] Liaison Consult Note   01/02/2024  Sheila Marquez Mar 25, 1951 [id number]  Insurance: [employer]   Primary Care Provider: Sherial Bail, MD, with [facility],  this provider is listed for the transition of care follow up appointments    [facility] Liaison reviewed patient for potential transition of care needs for community due to patient showing as a readmission.  However, patient was actually transferred from [facility].to ICU LOC at [facility].    The patient was assessed for potential Community Care Coordination service needs for post hospital transition for care coordination and SDOH intact as documented. Review of patient's electronic medical record reveals patient is for home..   Plan:   Referral request for community care coordination: No needs assessed for community    [facility] Community Care, Population Health does not replace or interfere with any arrangements made by the Inpatient Transition of Care team.   For questions contact:   Richerd Fish, RN, BSN, CCM [place]  [facility], [facility] Liaison Direct Dial: [phone] or secure chat Email: [email or web address]

## 2024-01-02 NOTE — Plan of Care (Signed)
  Problem: Nutrition: Goal: Adequate nutrition will be maintained Outcome: Progressing   Problem: Elimination: Goal: Will not experience complications related to bowel motility Outcome: Progressing   Problem: Pain Management: Goal: General experience of comfort will improve Outcome: Progressing   Problem: Safety: Goal: Ability to remain free from injury will improve Outcome: Progressing   Problem: Skin Integrity: Goal: Risk for impaired skin integrity will decrease Outcome: Progressing

## 2024-01-05 ENCOUNTER — Telehealth: Payer: Self-pay | Admitting: Physician Assistant

## 2024-01-05 ENCOUNTER — Other Ambulatory Visit (HOSPITAL_COMMUNITY): Payer: Self-pay

## 2024-01-05 NOTE — Telephone Encounter (Signed)
 Forwarding to Pharm D to review and advise.

## 2024-01-05 NOTE — Telephone Encounter (Signed)
 Pharmacy Patient Advocate Encounter  Received notification from [facility] [employer] that Prior Authorization for Repatha  140MG /ML syringes  has been APPROVED from 01/01/2023 to 07/01/2024. Ran test claim, Copay is $47.00. This test claim was processed through [facility]- copay amounts may vary at other pharmacies due to pharmacy/plan contracts, or as the patient moves through the different stages of their insurance plan.   PA #/Case ID/Reference #: [id number]

## 2024-01-05 NOTE — Telephone Encounter (Signed)
 New Message:     Patient says she has a cough and draining. She wants to ask Sheila Marquez what can she take for this, since she is on a blood thinner.?

## 2024-01-05 NOTE — Telephone Encounter (Signed)
 Left a message for patient to call office back regarding medication for cold.

## 2024-01-05 NOTE — Telephone Encounter (Signed)
 My favorite and safest option is a saline rinse kit.  You want to avoid any product that says its D or PE,  but DM (dextromethorphan) is ok to take for cough. The D means it has pseudoephedrine (Sudafed) in it and PE means it has phenylephrine in it. These both can increase your blood pressure and heart rate. You also want to avoid NSAID's like ibuprofen and naproxen. Tylenol  is ok to take for pain/fever. You can try saline nasal rinses (such as a netti pot) for congestion along with Flonase or Atrovent. Cough drops are ok too for sore throat. Antihistamines like chlorpheniramine (can cause drowsiness) is good for runny nose/sneezing. Antihistamines like allegra and zyrtec are ok to take as well. These do not cause drowsiness typically. For a productive cough, can take generic or brand Mucinex. For a dry cough generic or brand Delsym (dextromethorphan).

## 2024-01-06 NOTE — Telephone Encounter (Signed)
 Attempted to call the patient as there is no DPR on file to speak with Sheila Marquez. No answer- I left a message to please call back.

## 2024-01-07 NOTE — Telephone Encounter (Signed)
 Message sent via MyChart.

## 2024-01-08 NOTE — Telephone Encounter (Signed)
Left a message for the patient to call back if anything further was needed.

## 2024-01-08 NOTE — Discharge Summary (Signed)
Physician Discharge Summary   Patient: Sheila Marquez MRN: [id number] DOB: 03/15/1951  Admit date:     12/31/2023  Discharge date: 01/08/24  Discharge Physician: Floydene Flock   PCP: Enid Baas, MD   Recommendations at discharge:    Pt discharged to cardiology service in setting of  NSTEMI status post PCI to the mid LAD complicated by coronary perforation and small pericardial effusion for observation and close management. Please see H&P by Dr. Okey Dupre.   Discharge Diagnoses: Principal Problem:   NSTEMI (non-ST elevated myocardial infarction) (HCC) Active Problems:   HLD (hyperlipidemia)   HTN (hypertension)  Resolved Problems:   * No resolved hospital problems. *  Hospital Course: No notes on file  Assessment and Plan: * NSTEMI (non-ST elevated myocardial infarction) (HCC) Central chest pain since yesterday evening  Troponin 7800 EKG NSR w/ noted TWI in anteroseptal leads  Heparin gtt  ASA  Tentative plan for cardiac catheterization today  Risk stratification labs 2D ECHO  Follow up formal cardiology recommendations    HTN (hypertension) BP stable  Titrate home regimen    HLD (hyperlipidemia) Lipid panel pending in setting of NSTEMI  Noted baseline statin intolerance  Lipid management per cardiology recommendations           Consultants: Cardiology  Procedures performed: Cardiac catheterization   Disposition:  [facility]  Diet recommendation:  Cardiac and Carb modified diet DISCHARGE MEDICATION: Allergies as of 12/31/2023       Reactions   Statins Other (See Comments)   Myalgia   Hydrocodone Nausea And Vomiting   Propoxyphene Nausea And Vomiting        Medication List     ASK your doctor about these medications    calcium carbonate 1500 (600 Ca) MG Tabs tablet Commonly known as: OSCAL Take 600 mg of elemental calcium by mouth 2 (two) times daily with a meal.   cetirizine 10 MG tablet Commonly known as: ZYRTEC Take 10 mg by mouth  daily.   diphenhydrAMINE 25 MG tablet Commonly known as: BENADRYL Take 25 mg by mouth every 6 (six) hours as needed.   glucosamine-chondroitin 500-400 MG tablet Take 1 tablet by mouth 2 (two) times daily.   magnesium oxide 400 MG tablet Commonly known as: MAG-OX Take 400 mg by mouth daily.   psyllium 58.6 % packet Commonly known as: METAMUCIL Take 1 packet by mouth 2 (two) times daily.   zinc gluconate 50 MG tablet Take 50 mg by mouth daily.        Discharge Exam: Filed Weights   12/31/23 6295  Weight: 68 kg   Unable to perform physical exam at time of discharge    Condition at discharge: stable  The results of significant diagnostics from this hospitalization (including imaging, microbiology, ancillary and laboratory) are listed below for reference.   Imaging Studies: ECHOCARDIOGRAM COMPLETE Result Date: 01/01/2024    ECHOCARDIOGRAM REPORT   Patient Name:   Sheila Marquez Thompson Date of Exam: 01/01/2024 Medical Rec #:  [id number]         Height:       62.0 in Accession #:    [id number]        Weight:       150.0 lb Date of Birth:  12-11-1951         BSA:          1.692 m Patient Age:    72 years          BP:  112/96 mmHg Patient Gender: F                 HR:           86 bpm. Exam Location:  Inpatient Procedure: 2D Echo, Cardiac Doppler and Color Doppler Indications:    NSTEMI I21.4  History:        Patient has no prior history of Echocardiogram examinations.                 Previous Myocardial Infarction; Risk Factors:Hypertension and                 Dyslipidemia.  Sonographer:    Lucendia Herrlich RCS Referring Phys: [id number] Cyndi Bender IMPRESSIONS  1. Left ventricular ejection fraction, by estimation, is 45 to 50%. Left ventricular ejection fraction by 2D MOD biplane is 48.7 %. The left ventricle has mildly decreased function. The left ventricle demonstrates regional wall motion abnormalities (see  scoring diagram/findings for description). There is moderate asymmetric left  ventricular hypertrophy of the basal-septal segment. Left ventricular diastolic parameters are consistent with Grade I diastolic dysfunction (impaired relaxation). There is severe akinesis of the left ventricular, mid-apical anteroseptal wall, anterior wall and apical segment.  2. Right ventricular systolic function is normal. The right ventricular size is normal. Tricuspid regurgitation signal is inadequate for assessing PA pressure.  3. A small pericardial effusion is present. The pericardial effusion is localized near the right ventricle. There is no evidence of cardiac tamponade.  4. The mitral valve is abnormal. Trivial mitral valve regurgitation.  5. The aortic valve is tricuspid. Aortic valve regurgitation is not visualized. Aortic valve sclerosis is present, with no evidence of aortic valve stenosis.  6. The inferior vena cava is normal in size with <50% respiratory variability, suggesting right atrial pressure of 8 mmHg. Comparison(s): No prior Echocardiogram. Conclusion(s)/Recommendation(s): Findings suggest LAD territory infarct, probable at the mid-vessel portion. FINDINGS  Left Ventricle: Left ventricular ejection fraction, by estimation, is 45 to 50%. Left ventricular ejection fraction by 2D MOD biplane is 48.7 %. The left ventricle has mildly decreased function. The left ventricle demonstrates regional wall motion abnormalities. Severe akinesis of the left ventricular, mid-apical anteroseptal wall, anterior wall and apical segment. The left ventricular internal cavity size was normal in size. There is moderate asymmetric left ventricular hypertrophy of the basal-septal segment. Left ventricular diastolic parameters are consistent with Grade I diastolic dysfunction (impaired relaxation). Indeterminate filling pressures.  LV Wall Scoring: The mid and distal anterior septum and entire apex are akinetic. Right Ventricle: The right ventricular size is normal. No increase in right ventricular wall  thickness. Right ventricular systolic function is normal. Tricuspid regurgitation signal is inadequate for assessing PA pressure. Left Atrium: Left atrial size was normal in size. Right Atrium: Right atrial size was normal in size. Pericardium: A small pericardial effusion is present. The pericardial effusion is localized near the right ventricle. There is diastolic collapse of the right atrial wall and diastolic collapse of the right ventricular free wall. There is no evidence of cardiac tamponade. Mitral Valve: The mitral valve is abnormal. Mild mitral annular calcification. Trivial mitral valve regurgitation. Tricuspid Valve: The tricuspid valve is grossly normal. Tricuspid valve regurgitation is trivial. Aortic Valve: The aortic valve is tricuspid. Aortic valve regurgitation is not visualized. Aortic valve sclerosis is present, with no evidence of aortic valve stenosis. Aortic valve peak gradient measures 8.1 mmHg. Pulmonic Valve: The pulmonic valve was normal in structure. Pulmonic valve regurgitation is not visualized. Aorta: The aortic  root and ascending aorta are structurally normal, with no evidence of dilitation. Venous: The inferior vena cava is normal in size with less than 50% respiratory variability, suggesting right atrial pressure of 8 mmHg. IAS/Shunts: No atrial level shunt detected by color flow Doppler.  LEFT VENTRICLE PLAX 2D                        Biplane EF (MOD) LVIDd:         3.90 cm         LV Biplane EF:   Left LVIDs:         2.50 cm                          ventricular LV PW:         0.80 cm                          ejection LV IVS:        1.20 cm                          fraction by LVOT diam:     2.00 cm                          2D MOD LV SV:         47                               biplane is LV SV Index:   28                               48.7 %. LVOT Area:     3.14 cm                                Diastology                                LV e' medial:    5.87 cm/s LV Volumes (MOD)                LV E/e' medial:  9.3 LV vol d, MOD    60.3 ml       LV e' lateral:   2.94 cm/s A2C:                           LV E/e' lateral: 18.6 LV vol d, MOD    90.0 ml A4C: LV vol s, MOD    25.5 ml A2C: LV vol s, MOD    53.9 ml A4C: LV SV MOD A2C:   34.8 ml LV SV MOD A4C:   90.0 ml LV SV MOD BP:    36.2 ml RIGHT VENTRICLE             IVC RV S prime:     14.80 cm/s  IVC diam: 1.90 cm TAPSE (M-mode): 2.6 cm LEFT ATRIUM             Index        RIGHT ATRIUM  Index LA diam:        2.90 cm 1.71 cm/m   RA Area:     10.41 cm LA Vol (A2C):   29.1 ml 17.17 ml/m  RA Volume:   20.00 ml  11.82 ml/m LA Vol (A4C):   40.3 ml 23.82 ml/m LA Biplane Vol: 36.7 ml 21.69 ml/m  AORTIC VALVE AV Area (Vmax): 2.26 cm AV Vmax:        142.00 cm/s AV Peak Grad:   8.1 mmHg LVOT Vmax:      102.07 cm/s LVOT Vmean:     62.433 cm/s LVOT VTI:       0.151 m  AORTA Ao Root diam: 3.00 cm Ao Asc diam:  2.90 cm MITRAL VALVE MV Area (PHT): 4.39 cm     SHUNTS MV Decel Time: 173 msec     Systemic VTI:  0.15 m MV E velocity: 54.60 cm/s   Systemic Diam: 2.00 cm MV A velocity: 103.00 cm/s MV E/A ratio:  0.53 Zoila Shutter MD Electronically signed by Zoila Shutter MD Signature Date/Time: 01/01/2024/1:16:49 PM    Final    CARDIAC CATHETERIZATION Result Date: 12/31/2023 Conclusions: Severe single-vessel coronary artery disease with occlusion of mid LAD.  Minimal luminal irregularities are noted in the dominant LCx. Moderately to severely reduced left ventricular systolic function (LVEF 30-35%) with mid/apical anterior and apical inferior hypokinesis/akinesis. Normal left ventricular filling pressure (LVEDP 7 mmHg). Successful IVUS-guided PCI to mid LAD using Onyx Frontier 2.25 x 30 mm drug-eluting stent (postdilated to 3.0 mm proximally) with 0% residual stenosis and TIMI-3 flow.  Mid LAD occlusion was challenging to cross and was complicated by wire perforation of a small branch (most likely a tiny diagonal).  Wire perforation controlled with  balloon tamponade in the proximal LAD.  Small pericardial effusion noted at the time of perforation, unchanged case.  The patient remained hemodynamically stable throughout the procedure. Small and tortuous right radial artery, not suitable for catheterization.  Procedure performed via the right ulnar artery. Recommendations: Transfer to [facility] cardiac ICU for monitoring given wire perforation and small pericardial effusion.  If patient becomes hemodynamically unstable, STAT echocardiogram and consultation with interventional cardiology/cardiac surgery will need to be pursued.  Case discussed with interventional cardiology team at [facility]. Obtain echocardiogram tomorrow morning, sooner if patient becomes unstable. Dual antiplatelet therapy with aspirin and ticagrelor for at least 12 months. Aggressive secondary prevention of CAD.  Consider initiation of PCSK9 inhibitor at discharge given history of statin intolerance. Yvonne Kendall, MD [facility]  DG Chest 1 View Result Date: 12/31/2023 CLINICAL DATA:  [id number] with chest pain.  History of hypertension. EXAM: CHEST  1 VIEW COMPARISON:  None Available. FINDINGS: The heart size and mediastinal contours are within normal limits. Both lungs are clear. The visualized skeletal structures are unremarkable. There are artifacts from overlying clothing. IMPRESSION: No evidence of acute chest disease. Electronically Signed   By: Almira Bar M.D.   On: 12/31/2023 08:00    Microbiology: Results for orders placed or performed during the hospital encounter of 05/24/20  SARS CORONAVIRUS 2 (TAT 6-24 HRS) Nasopharyngeal Nasopharyngeal Swab     Status: None   Collection Time: 05/24/20 12:16 PM   Specimen: Nasopharyngeal Swab  Result Value Ref Range Status   SARS Coronavirus 2 NEGATIVE NEGATIVE Final    Comment: (NOTE) SARS-CoV-2 target nucleic acids are NOT DETECTED. The SARS-CoV-2 RNA is generally detectable in upper and lower respiratory specimens during  the acute phase of infection. Negative results  do not preclude SARS-CoV-2 infection, do not rule out co-infections with other pathogens, and should not be used as the sole basis for treatment or other patient management decisions. Negative results must be combined with clinical observations, patient history, and epidemiological information. The expected result is Negative. Fact Sheet for Patients: HairSlick.no Fact Sheet for Healthcare Providers: quierodirigir.com This test is not yet approved or cleared by the [place] FDA and  has been authorized for detection and/or diagnosis of SARS-CoV-2 by FDA under an Emergency Use Authorization (EUA). This EUA will remain  in effect (meaning this test can be used) for the duration of the COVID-19 declaration under Section 56 4(b)(1) of the Act, 21 U.S.C. section 360bbb-3(b)(1), unless the authorization is terminated or revoked sooner. Performed at [facility], [address] [place], [place] [address]     Labs: CBC: Recent Labs  Lab 01/02/24 0222  WBC 8.4  HGB 12.7  HCT 36.9  MCV 93.2  PLT 179   Basic Metabolic Panel: Recent Labs  Lab 01/02/24 0222  NA 136  K 4.0  CL 105  CO2 23  GLUCOSE 107*  BUN 15  CREATININE 0.72  CALCIUM 9.3   Liver Function Tests: No results for input(s): "AST", "ALT", "ALKPHOS", "BILITOT", "PROT", "ALBUMIN" in the last 168 hours. CBG: No results for input(s): "GLUCAP" in the last 168 hours.  Discharge time spent: less than 30 minutes.  Signed: Floydene Flock, MD [facility] 01/08/2024

## 2024-01-09 DIAGNOSIS — I10 Essential (primary) hypertension: Secondary | ICD-10-CM | POA: Diagnosis not present

## 2024-01-09 DIAGNOSIS — J40 Bronchitis, not specified as acute or chronic: Secondary | ICD-10-CM | POA: Diagnosis not present

## 2024-01-09 DIAGNOSIS — I214 Non-ST elevation (NSTEMI) myocardial infarction: Secondary | ICD-10-CM | POA: Diagnosis not present

## 2024-01-09 DIAGNOSIS — E782 Mixed hyperlipidemia: Secondary | ICD-10-CM | POA: Diagnosis not present

## 2024-01-13 ENCOUNTER — Ambulatory Visit: Payer: PPO | Attending: Physician Assistant | Admitting: Physician Assistant

## 2024-01-13 ENCOUNTER — Emergency Department
Admit: 2024-01-13 | Discharge: 2024-01-13 | Disposition: A | Discharge status: Home / Self Care | Payer: PPO | Attending: Emergency Medicine | Admitting: Emergency Medicine

## 2024-01-13 ENCOUNTER — Other Ambulatory Visit: Payer: Self-pay

## 2024-01-13 ENCOUNTER — Encounter: Payer: Self-pay | Admitting: Physician Assistant

## 2024-01-13 ENCOUNTER — Emergency Department: Payer: Self-pay

## 2024-01-13 ENCOUNTER — Other Ambulatory Visit: Payer: Self-pay | Admitting: Emergency Medicine

## 2024-01-13 ENCOUNTER — Encounter: Payer: Self-pay | Admitting: Emergency Medicine

## 2024-01-13 ENCOUNTER — Emergency Department
Admission: EM | Admit: 2024-01-13 | Discharge: 2024-01-13 | Disposition: A | Discharge status: Home / Self Care | Payer: PPO | Attending: Emergency Medicine | Admitting: Emergency Medicine

## 2024-01-13 VITALS — BP 118/80 | HR 78 | Ht 62.0 in | Wt 139.4 lb

## 2024-01-13 DIAGNOSIS — I2510 Atherosclerotic heart disease of native coronary artery without angina pectoris: Secondary | ICD-10-CM

## 2024-01-13 DIAGNOSIS — I5020 Unspecified systolic (congestive) heart failure: Secondary | ICD-10-CM | POA: Diagnosis not present

## 2024-01-13 DIAGNOSIS — I5022 Chronic systolic (congestive) heart failure: Secondary | ICD-10-CM

## 2024-01-13 DIAGNOSIS — R9431 Abnormal electrocardiogram [ECG] [EKG]: Secondary | ICD-10-CM

## 2024-01-13 DIAGNOSIS — R55 Syncope and collapse: Secondary | ICD-10-CM

## 2024-01-13 DIAGNOSIS — R079 Chest pain, unspecified: Secondary | ICD-10-CM

## 2024-01-13 DIAGNOSIS — R0989 Other specified symptoms and signs involving the circulatory and respiratory systems: Secondary | ICD-10-CM | POA: Diagnosis not present

## 2024-01-13 DIAGNOSIS — T148XXA Other injury of unspecified body region, initial encounter: Secondary | ICD-10-CM

## 2024-01-13 DIAGNOSIS — I10 Essential (primary) hypertension: Secondary | ICD-10-CM | POA: Diagnosis not present

## 2024-01-13 DIAGNOSIS — I255 Ischemic cardiomyopathy: Secondary | ICD-10-CM

## 2024-01-13 DIAGNOSIS — Z789 Other specified health status: Secondary | ICD-10-CM

## 2024-01-13 DIAGNOSIS — I214 Non-ST elevation (NSTEMI) myocardial infarction: Secondary | ICD-10-CM | POA: Diagnosis not present

## 2024-01-13 DIAGNOSIS — N289 Disorder of kidney and ureter, unspecified: Secondary | ICD-10-CM | POA: Insufficient documentation

## 2024-01-13 DIAGNOSIS — R7989 Other specified abnormal findings of blood chemistry: Secondary | ICD-10-CM | POA: Diagnosis not present

## 2024-01-13 DIAGNOSIS — T81718A Complication of other artery following a procedure, not elsewhere classified, initial encounter: Secondary | ICD-10-CM

## 2024-01-13 DIAGNOSIS — I3139 Other pericardial effusion (noninflammatory): Secondary | ICD-10-CM

## 2024-01-13 DIAGNOSIS — R0789 Other chest pain: Secondary | ICD-10-CM | POA: Diagnosis not present

## 2024-01-13 DIAGNOSIS — I499 Cardiac arrhythmia, unspecified: Secondary | ICD-10-CM | POA: Diagnosis not present

## 2024-01-13 DIAGNOSIS — R42 Dizziness and giddiness: Secondary | ICD-10-CM | POA: Diagnosis not present

## 2024-01-13 DIAGNOSIS — T81718D Complication of other artery following a procedure, not elsewhere classified, subsequent encounter: Secondary | ICD-10-CM

## 2024-01-13 DIAGNOSIS — E785 Hyperlipidemia, unspecified: Secondary | ICD-10-CM

## 2024-01-13 DIAGNOSIS — J111 Influenza due to unidentified influenza virus with other respiratory manifestations: Secondary | ICD-10-CM

## 2024-01-13 LAB — ECHOCARDIOGRAM LIMITED
Height: 62 in
S' Lateral: 3 cm
Weight: 2229.29 [oz_av]

## 2024-01-13 LAB — CBC
HCT: 40.5 % (ref 36.0–46.0)
Hemoglobin: 13.8 g/dL (ref 12.0–15.0)
MCH: 31.4 pg (ref 26.0–34.0)
MCHC: 34.1 g/dL (ref 30.0–36.0)
MCV: 92.3 fL (ref 80.0–100.0)
Platelets: 285 10*3/uL (ref 150–400)
RBC: 4.39 MIL/uL (ref 3.87–5.11)
RDW: 13.1 % (ref 11.5–15.5)
WBC: 8.1 10*3/uL (ref 4.0–10.5)
nRBC: 0 % (ref 0.0–0.2)

## 2024-01-13 LAB — COMPREHENSIVE METABOLIC PANEL
ALT: 36 U/L (ref 0–44)
AST: 43 U/L — ABNORMAL HIGH (ref 15–41)
Albumin: 3.5 g/dL (ref 3.5–5.0)
Alkaline Phosphatase: 46 U/L (ref 38–126)
Anion gap: 10 (ref 5–15)
BUN: 18 mg/dL (ref 8–23)
CO2: 27 mmol/L (ref 22–32)
Calcium: 10.9 mg/dL — ABNORMAL HIGH (ref 8.9–10.3)
Chloride: 100 mmol/L (ref 98–111)
Creatinine, Ser: 1.08 mg/dL — ABNORMAL HIGH (ref 0.44–1.00)
GFR, Estimated: 54 mL/min — ABNORMAL LOW (ref >60)
Glucose, Bld: 108 mg/dL — ABNORMAL HIGH (ref 70–99)
Potassium: 3.5 mmol/L (ref 3.5–5.1)
Sodium: 137 mmol/L (ref 135–145)
Total Bilirubin: 0.9 mg/dL (ref 0.0–1.2)
Total Protein: 6.9 g/dL (ref 6.5–8.1)

## 2024-01-13 LAB — TROPONIN I (HIGH SENSITIVITY)
Troponin I (High Sensitivity): 46 ng/L — ABNORMAL HIGH (ref <18)
Troponin I (High Sensitivity): 54 ng/L — ABNORMAL HIGH (ref <18)
Troponin I (High Sensitivity): 46 ng/L — ABNORMAL HIGH (ref <18)

## 2024-01-13 MED ORDER — ONDANSETRON HCL 4 MG/2ML IJ SOLN
4.0000 mg | Freq: Once | INTRAMUSCULAR | Status: AC
  Administered 2024-01-13: 4 mg via INTRAVENOUS
  Filled 2024-01-13: qty 2

## 2024-01-13 MED ORDER — SODIUM CHLORIDE 0.9 % IV BOLUS
1000.0000 mL | Freq: Once | INTRAVENOUS | Status: AC
  Administered 2024-01-13: 1000 mL via INTRAVENOUS

## 2024-01-13 MED ORDER — ASPIRIN 81 MG PO CHEW
324.0000 mg | CHEWABLE_TABLET | Freq: Once | ORAL | Status: AC
  Administered 2024-01-13: 324 mg via ORAL

## 2024-01-13 NOTE — ED Notes (Signed)
 Patient discharged from ED by provider. Discharge instructions reviewed with patient and all questions answered. Patient ambulatory from ED in NAD.

## 2024-01-13 NOTE — Patient Instructions (Signed)
Medication Instructions:  Your Physician recommend you continue on your current medication as directed.    *If you need a refill on your cardiac medications before your next appointment, please call your pharmacy*   Lab Work: Your provider would like for you to have following labs drawn today CBC.   If you have labs (blood work) drawn today and your tests are completely normal, you will receive your results only by: MyChart Message (if you have MyChart) OR A paper copy in the mail If you have any lab test that is abnormal or we need to change your treatment, we will call you to review the results.   Testing/Procedures: Your physician has requested that you have an LIMITED echocardiogram. Echocardiography is a painless test that uses sound waves to create images of your heart. It provides your doctor with information about the size and shape of your heart and how well your heart's chambers and valves are working.   You may receive an ultrasound enhancing agent through an IV if needed to better visualize your heart during the echo. This procedure takes approximately one hour.  There are no restrictions for this procedure.  This will take place at [address] [facility] [address]) [address], [place] [address]  Please note: We ask at that you not bring children with you during ultrasound (echo/ vascular) testing. Due to room size and safety concerns, children are not allowed in the ultrasound rooms during exams. Our front office staff cannot provide observation of children in our lobby area while testing is being conducted. An adult accompanying a patient to their appointment will only be allowed in the ultrasound room at the discretion of the ultrasound technician under special circumstances. We apologize for any inconvenience.   Follow-Up: At [facility], you and your health needs are our priority.  As part of our continuing mission to provide you with exceptional heart care, we  have created designated Provider Care Teams.  These Care Teams include your primary Cardiologist (physician) and Advanced Practice Providers (APPs -  Physician Assistants and Nurse Practitioners) who all work together to provide you with the care you need, when you need it.  We recommend signing up for the patient portal called "MyChart".  Sign up information is provided on this After Visit Summary.  MyChart is used to connect with patients for Virtual Visits (Telemedicine).  Patients are able to view lab/test results, encounter notes, upcoming appointments, etc.  Non-urgent messages can be sent to your provider as well.   To learn more about what you can do with MyChart, go to [email or web address].    Your next appointment:   1 month(s)  Provider:   You may see Julien Nordmann, MD or one of the following Advanced Practice Providers on your designated Care Team:   Eula Listen, [place]

## 2024-01-13 NOTE — ED Provider Notes (Signed)
[facility] Provider Note    Event Date/Time   First MD Initiated Contact with Patient 01/13/24 0945     (approximate)  History   Chief Complaint: Chest Pain  HPI  Sheila Marquez is a 73 y.o. female with a past medical history of hypertension, hyperlipidemia, presents to the emergency department after syncopal episode.  According to the patient as well as report from her cardiologist she was at the cardiology office where she was being seen for routine follow-up as the patient had a LAD stent placed 2 weeks ago complicated by a coronary perforation requiring a short stay at [facility] without any further intervention needed.  Patient was at the cardiology office today for routine follow-up.  Patient states 2 days ago she was diagnosed with influenza.  Patient denies any chest pain at any point today.  Patient states she has had multiple syncopal episodes in the past.  Patient's EKG after the event showed some mild changes from her baseline EKG per cardiology however her repeat EKG in the emergency department shows no change and is overall reassuring.  Physical Exam   Triage Vital Signs: ED Triage Vitals  Encounter Vitals Group     BP 01/13/24 0959 (!) 144/69     Systolic BP Percentile --      Diastolic BP Percentile --      Pulse Rate 01/13/24 0959 65     Resp 01/13/24 0959 17     Temp 01/13/24 0959 98 F (36.7 C)     Temp Source 01/13/24 0959 Oral     SpO2 01/13/24 0959 96 %     Weight 01/13/24 0957 139 lb 5.3 oz (63.2 kg)     Height 01/13/24 0957 5\' 2"  (1.575 m)     Head Circumference --      Peak Flow --      Pain Score 01/13/24 0957 0     Pain Loc --      Pain Education --      Exclude from Growth Chart --     Most recent vital signs: Vitals:   01/13/24 0959  BP: (!) 144/69  Pulse: 65  Resp: 17  Temp: 98 F (36.7 C)  SpO2: 96%    General: Awake, no distress.  CV:  Good peripheral perfusion.  Regular rate and rhythm   Resp:  Normal effort.  Equal breath sounds bilaterally.  Abd:  No distention.  Soft, nontender.  No rebound or guarding.  ED Results / Procedures / Treatments   EKG  EKG viewed and interpreted by myself shows a normal sinus rhythm at 66 bpm with a narrow QRS, normal axis, normal intervals, nonspecific ST changes with anterolateral T wave inversions.  MEDICATIONS ORDERED IN ED: Medications  sodium chloride 0.9 % bolus 1,000 mL (1,000 mLs Intravenous New Bag/Given 01/13/24 1015)  ondansetron (ZOFRAN) injection 4 mg (4 mg Intravenous Given 01/13/24 1015)     IMPRESSION / MDM / ASSESSMENT AND PLAN / ED COURSE  I reviewed the triage vital signs and the nursing notes.  Patient's presentation is most consistent with acute presentation with potential threat to life or bodily function.  Patient presents to the emergency department after a syncopal episode at her cardiology office.  I spoke to the patient's cardiologist who is also been by to see the patient.  Given the patient's recent LAD stent we will check labs including cardiac enzymes x 2.  Given the recent coronary perforation we will obtain  an echocardiogram to rule out effusion.  Overall patient appears well, we will IV hydrate and continue to closely monitor in the emergency department while awaiting further workup.  Differential would include syncope possibly due to vasovagal episode, possibly related to her recent influenza A diagnosis, or possible cardiac etiology or pericardial effusion.  Patient's labs show very slight troponin elevation to 46 on repeat it is 54.  Patient's chemistry reassuring slight renal insufficiency but no concerning findings.  CBC is normal.  I spoke to Dr. Okey Dupre who has been down to see the patient once again, he reviewed the echocardiogram and did not find any urgent or emergent findings on the echocardiogram largely unchanged from prior echo.  He is having one of his nurses apply a Holter monitor to the patient.   Given the slight increase in troponin the plan is to repeat a troponin 2 hours after the last troponin for a third time.  If the third troponin continues to show elevation we will discuss with cardiology to help with disposition, if the third troponin shows no increase or if it decreases then the patient would be safe for discharge home with close outpatient follow-up.  Patient is agreeable to this and strongly wishes to avoid a hospital admission if at all possible.  Patient has a history of syncopal events in the past, and also was recently diagnosed with influenza A and continues to have symptoms from that which very well could be contributing to her syncopal episode and.  Patient is agreeable to this plan of care.  Patient care signed out to oncoming provider third troponin pending.  FINAL CLINICAL IMPRESSION(S) / ED DIAGNOSES   Syncope  Note:  This document was prepared using Dragon voice recognition software and may include unintentional dictation errors.   Minna Antis, MD 01/13/24 1436

## 2024-01-13 NOTE — Progress Notes (Signed)
Cardiology Office Note    Date:  01/13/2024   ID:  Andromeda, Gren [birth year], MRN [id number]  PCP:  Enid Baas, MD  Cardiologist:  Julien Nordmann, MD  Electrophysiologist:  None   Chief Complaint: Hospital follow-up  History of Present Illness:   Sheila Marquez is a 73 y.o. female with history of CAD with recent NSTEMI status post PCI/DES to the mid LAD complicated by wire perforation of a likely tiny diagonal branch on 12/31/2023, HFrEF secondary to ICM, HTN, and HLD with statin intolerance who presents for hospital follow-up as outlined below.  She was admitted to [facility] on 12/31/2023 with an NSTEMI after developing symptoms of angina the night prior.  EKG showed sinus tachycardia with 1 mm of ST elevation isolated in V2 with septal T wave inversions and anteroseptal Q waves.  High-sensitivity troponin trended to 9158.  LHC 12/31/2023 showed severe single-vessel CAD with occlusion of the mid LAD.  Minimal luminal irregularities were noted in the dominant LCx.  Moderately to severely reduced LV systolic function with an EF of 30 to 35% with mid/apical anterior and apical inferior hypokinesis/akinesis.  Normal LV filling pressure.  She underwent successful IVUS guided PCI to the mid LAD.  The mid LAD occlusion was challenging to cross and was complicated by wire perforation of a small branch (felt to most likely be a tiny diagonal).  Wire perforation controlled with balloon tamponade in the proximal LAD.  Small pericardial effusion was noted at the time of perforation.  Serial echocardiogram performed at bedside demonstrated stable small pericardial effusion.  Patient remained hemodynamically stable throughout the procedure.  She was subsequently transferred to [facility].  Follow-up echo on 01/01/2024 showed an EF of 45 to 50%, moderate asymmetric LVH of the basal septal segment, grade 1 diastolic dysfunction, severe akinesis of the mid apical anteroseptal wall, anterior wall, and apical  segment, normal RV systolic function and ventricular cavity size, small pericardial effusion located near the RV with no evidence of cardiac tamponade, trivial mitral regurgitation, and an estimated right atrial pressure of 3 mmHg.  Post-cath course further complicated by right arm hematoma with good perfusion.  She comes in accompanied by her daughter today.  She was diagnosed with shingles on 01/02/2024.  She was not prescribed antiviral therapy.  More recently, she was diagnosed with influenza 2 days ago.  In this setting, she has had increased fatigue and diminished appetite.  She is without symptoms of angina or cardiac decompensation.  No presyncope or syncope.  No falls or symptoms concerning for bleeding.  Right radial arteriotomy site hematoma is improving.  Original bandage remains in place.  _________________  Following completion of our office visit today I was brought back into the patient's exam room emergently after the patient began to feel flushed and had a syncopal episode lasting for 1 to 2 minutes per daughter.  The patient felt anxious about having labs drawn the patient denied dyspnea or chest pain.  She kept saying she felt "hot."  Initial blood pressure of 107/71 with a heart rate of 52 bpm.  Oxygen saturation 97% on room air.  Temperature 97.5 F.  Glucose 137.  Repeat EKG showed sinus bradycardia, 52 bpm, inferior ST elevation with stable anterior T wave inversion with new T wave inversion along V6.  EKG was transmitted to interventional cardiology STEMI MD on-call who reviewed the tracing and prior cath findings.  Hypotension and bradycardia resolved spontaneously office.  She was given aspirin 324 mg  in the office.  Repeat EKG showed good NSR, 71 bpm, and normalization of inferior ST segments with persistent anterior T wave inversion.  She was transported by EMS to the ED where she remained hemodynamically stable.  High-sensitivity troponin 46 with a delta troponin of 54 subsequently  downtrending to 46.  Renal function mildly elevated at 1.08 with a baseline around 0.7.  CBC unremarkable.  Chest x-ray without evidence of acute cardiopulmonary process.  In the ED, she was evaluated by interventional cardiology and reported she felt back to baseline.  She remained without chest pain or dyspnea.  She also reported similar episodes of feeling hot and getting lightheaded in the remote past as well as anxiety about blood draws.   Labs independently reviewed: 12/2023 - potassium 4.0, BUN 15, serum creatinine 0.72, Hgb 12.7, PLT 179, TC 163, TG 51, HDL 80, LDL 73, LP(a) less than 8.4, TSH normal 03/2023 - albumin 4.4, AST/ALT normal, A1c 6.0  Past Medical History:  Diagnosis Date   Eczema    Heart murmur    Hyperlipidemia    Hypertension    Plantar fasciitis     Past Surgical History:  Procedure Laterality Date   ANAL FISSURECTOMY     ANAL SPHINCTEROTOMY     COLONOSCOPY     COLONOSCOPY WITH PROPOFOL N/A 05/26/2020   Procedure: COLONOSCOPY WITH PROPOFOL;  Surgeon: Earline Mayotte, MD;  Location: [facility] ENDOSCOPY;  Service: Endoscopy;  Laterality: N/A;   CORONARY STENT INTERVENTION N/A 12/31/2023   Procedure: CORONARY STENT INTERVENTION;  Surgeon: Yvonne Kendall, MD;  Location: [facility] INVASIVE CV LAB;  Service: Cardiovascular;  Laterality: N/A;   CORONARY ULTRASOUND/IVUS N/A 12/31/2023   Procedure: Coronary Ultrasound/IVUS;  Surgeon: Yvonne Kendall, MD;  Location: [facility] INVASIVE CV LAB;  Service: Cardiovascular;  Laterality: N/A;   KNEE ARTHROSCOPY     LEFT HEART CATH AND CORONARY ANGIOGRAPHY N/A 12/31/2023   Procedure: LEFT HEART CATH AND CORONARY ANGIOGRAPHY;  Surgeon: Yvonne Kendall, MD;  Location: [facility] INVASIVE CV LAB;  Service: Cardiovascular;  Laterality: N/A;    Current Medications: Current Meds  Medication Sig   acetaminophen (TYLENOL) 500 MG tablet Take 500 mg by mouth every 6 (six) hours as needed for mild pain (pain score 1-3) or fever.   aspirin 81 MG chewable  tablet Chew 1 tablet (81 mg total) by mouth daily.   azithromycin (ZITHROMAX) 250 MG tablet Take 2 tablets (500mg ) by mouth on Day 1. Take 1 tablet (250mg ) by mouth on Days 2-5.   benzonatate (TESSALON) 200 MG capsule Take by mouth.   calcium carbonate (OSCAL) 1500 (600 Ca) MG TABS tablet Take 600 mg of elemental calcium by mouth 2 (two) times daily with a meal.   cetirizine (ZYRTEC) 10 MG tablet Take 10 mg by mouth daily.   diphenhydrAMINE (BENADRYL) 25 MG tablet Take 25 mg by mouth every 6 (six) hours as needed.   docusate sodium (COLACE) 50 MG capsule Take 50 mg by mouth 2 (two) times daily as needed for mild constipation.   ezetimibe (ZETIA) 10 MG tablet Take 1 tablet by mouth daily.   glucosamine-chondroitin 500-400 MG tablet Take 1 tablet by mouth 2 (two) times daily.   losartan (COZAAR) 25 MG tablet Take 0.5 tablets (12.5 mg total) by mouth daily.   magnesium oxide (MAG-OX) 400 MG tablet Take 400 mg by mouth daily.   metoprolol succinate (TOPROL-XL) 25 MG 24 hr tablet Take 0.5 tablets (12.5 mg total) by mouth daily.   Multiple Vitamin (MULTI-VITAMIN) tablet Take  1 tablet by mouth daily.   oseltamivir (TAMIFLU) 75 MG capsule Take by mouth.   predniSONE (DELTASONE) 10 MG tablet 40mg  x 2 days, 30mg  x 2days, 20mg  x 2days, 10mg  x 2days   psyllium (METAMUCIL) 58.6 % packet Take 1 packet by mouth 2 (two) times daily.   spironolactone (ALDACTONE) 25 MG tablet Take 0.5 tablets (12.5 mg total) by mouth daily.   ticagrelor (BRILINTA) 90 MG TABS tablet Take 1 tablet (90 mg total) by mouth 2 (two) times daily.   zinc gluconate 50 MG tablet Take 50 mg by mouth daily.    Allergies:   Statins, Hydrocodone, and Propoxyphene   Social History   Socioeconomic History   Marital status: Widowed    Spouse name: Not on file   Number of children: Not on file   Years of education: Not on file   Highest education level: Not on file  Occupational History   Not on file  Tobacco Use   Smoking status:  Never   Smokeless tobacco: Never  Vaping Use   Vaping status: Never Used  Substance and Sexual Activity   Alcohol use: Yes    Comment: none last 24hrs   Drug use: Never   Sexual activity: Not Currently  Other Topics Concern   Not on file  Social History Narrative   Not on file   Social Drivers of Health   Financial Resource Strain: Low Risk  (10/20/2023)   Received from [facility]   Overall Financial Resource Strain (CARDIA)    Difficulty of Paying Living Expenses: Not hard at all  Food Insecurity: No Food Insecurity (01/01/2024)   Hunger Vital Sign    Worried About Running Out of Food in the Last Year: Never true    Ran Out of Food in the Last Year: Never true  Transportation Needs: No Transportation Needs (01/01/2024)   PRAPARE - Administrator, Civil Service (Medical): No    Lack of Transportation (Non-Medical): No  Physical Activity: Not on file  Stress: Not on file  Social Connections: Moderately Integrated (01/01/2024)   Social Connection and Isolation Panel [NHANES]    Frequency of Communication with Friends and Family: Three times a week    Frequency of Social Gatherings with Friends and Family: Three times a week    Attends Religious Services: More than 4 times per year    Active Member of Clubs or Organizations: Yes    Attends Banker Meetings: 1 to 4 times per year    Marital Status: Widowed     Family History:  The patient's family history includes Breast cancer in her cousin; CAD in her brother.  ROS:   12-point review of systems is negative unless otherwise noted in the HPI.   EKGs/Labs/Other Studies Reviewed:    Studies reviewed were summarized above. The additional studies were reviewed today:  LHC 12/31/2023: Conclusions: Severe single-vessel coronary artery disease with occlusion of mid LAD.  Minimal luminal irregularities are noted in the dominant LCx. Moderately to severely reduced left ventricular systolic  function (LVEF 30-35%) with mid/apical anterior and apical inferior hypokinesis/akinesis. Normal left ventricular filling pressure (LVEDP 7 mmHg). Successful IVUS-guided PCI to mid LAD using Onyx Frontier 2.25 x 30 mm drug-eluting stent (postdilated to 3.0 mm proximally) with 0% residual stenosis and TIMI-3 flow.  Mid LAD occlusion was challenging to cross and was complicated by wire perforation of a small branch (most likely a tiny diagonal).  Wire perforation controlled with balloon  tamponade in the proximal LAD.  Small pericardial effusion noted at the time of perforation, unchanged case.  The patient remained hemodynamically stable throughout the procedure. Small and tortuous right radial artery, not suitable for catheterization.  Procedure performed via the right ulnar artery.   Recommendations: Transfer to [facility] cardiac ICU for monitoring given wire perforation and small pericardial effusion.  If patient becomes hemodynamically unstable, STAT echocardiogram and consultation with interventional cardiology/cardiac surgery will need to be pursued.  Case discussed with interventional cardiology team at [facility]. Obtain echocardiogram tomorrow morning, sooner if patient becomes unstable. Dual antiplatelet therapy with aspirin and ticagrelor for at least 12 months. Aggressive secondary prevention of CAD.  Consider initiation of PCSK9 inhibitor at discharge given history of statin intolerance. __________  2D echo 01/01/2024: 1. Left ventricular ejection fraction, by estimation, is 45 to 50%. Left  ventricular ejection fraction by 2D MOD biplane is 48.7 %. The left  ventricle has mildly decreased function. The left ventricle demonstrates  regional wall motion abnormalities (see   scoring diagram/findings for description). There is moderate asymmetric  left ventricular hypertrophy of the basal-septal segment. Left ventricular  diastolic parameters are consistent with Grade I diastolic  dysfunction  (impaired relaxation). There is  severe akinesis of the left ventricular, mid-apical anteroseptal wall,  anterior wall and apical segment.   2. Right ventricular systolic function is normal. The right ventricular  size is normal. Tricuspid regurgitation signal is inadequate for assessing  PA pressure.   3. A small pericardial effusion is present. The pericardial effusion is  localized near the right ventricle. There is no evidence of cardiac  tamponade.   4. The mitral valve is abnormal. Trivial mitral valve regurgitation.   5. The aortic valve is tricuspid. Aortic valve regurgitation is not  visualized. Aortic valve sclerosis is present, with no evidence of aortic  valve stenosis.   6. The inferior vena cava is normal in size with <50% respiratory  variability, suggesting right atrial pressure of 8 mmHg.   Comparison(s): No prior Echocardiogram.   Conclusion(s)/Recommendation(s): Findings suggest LAD territory infarct,  probable at the mid-vessel portion.     EKG:  EKG is ordered today.  The EKG ordered today 08:39 demonstrates NSR, 78 bpm, prior anteroseptal infarct with anterolateral T wave inversion, consistent with recently late presenting MI.  Repeat at 09:10 demonstrates sinus bradycardia, 52 bpm, inferior ST elevation with stable anterior T wave inversion with new T wave inversion in V6.  Repeat at 09:20 demonstrates sinus rhythm, 71 bpm, improving inferior ST segment changes with persistent anterolateral T wave inversion.  Recent Labs: 12/31/2023: TSH 0.963 01/13/2024: ALT 36; BUN 18; Creatinine, Ser 1.08; Hemoglobin 13.8; Platelets 285; Potassium 3.5; Sodium 137  Recent Lipid Panel    Component Value Date/Time   CHOL 163 01/02/2024 0222   TRIG 51 01/02/2024 0222   HDL 80 01/02/2024 0222   CHOLHDL 2.0 01/02/2024 0222   VLDL 10 01/02/2024 0222   LDLCALC 73 01/02/2024 0222    PHYSICAL EXAM:    VS:  BP 118/80 (BP Location: Left Arm, Patient Position: Sitting,  Cuff Size: Normal)   Pulse 78   Ht 5\' 2"  (1.575 m)   Wt 139 lb 6.4 oz (63.2 kg)   LMP  (LMP Unknown)   SpO2 99%   BMI 25.50 kg/m   BMI: Body mass index is 25.5 kg/m.  Physical Exam Constitutional:      Appearance: She is well-developed.  HENT:     Head: Normocephalic and  atraumatic.  Eyes:     General:        Right eye: No discharge.        Left eye: No discharge.  Neck:     Vascular: No JVD.  Cardiovascular:     Rate and Rhythm: Normal rate and regular rhythm.     Heart sounds: Normal heart sounds, S1 normal and S2 normal. Heart sounds not distant. No midsystolic click and no opening snap. No murmur heard.    No friction rub.     Comments: Improving right radial arteriotomy site hematoma with ecchymosis noted at this time.  Radial pulse 2+ proximal and distal to the arteriotomy site. Pulmonary:     Effort: Pulmonary effort is normal. No respiratory distress.     Breath sounds: Rhonchi present.     Comments: Coarse breath sounds with scattered rhonchi. Chest:     Chest wall: No tenderness.  Abdominal:     General: There is no distension.  Musculoskeletal:     Cervical back: Normal range of motion.     Right lower leg: No edema.     Left lower leg: No edema.  Skin:    General: Skin is warm and dry.     Nails: There is no clubbing.  Neurological:     Mental Status: She is alert and oriented to person, place, and time.  Psychiatric:        Speech: Speech normal.        Behavior: Behavior normal.        Thought Content: Thought content normal.        Judgment: Judgment normal.     Wt Readings from Last 3 Encounters:  01/13/24 139 lb 5.3 oz (63.2 kg)  01/13/24 139 lb 6.4 oz (63.2 kg)  01/01/24 150 lb (68 kg)     ASSESSMENT & PLAN:   CAD involving the native coronary arteries with recent NSTEMI: She was without symptoms of angina or cardiac decompensation during our initial visit.  Recommendation was for the patient to continue aspirin 81 mg daily and ticagrelor  90 mg twice daily without interruption.  Importance of antiplatelet therapy was discussed.  Coronary artery perforation with pericardial effusion: Hemodynamically stable.  Surface echo 10/31/2024 with stable small pericardial effusion without evidence of cardiac tamponade.  Repeat limited echo.  HFmrEF secondary to ICM: Euvolemic and well compensated.Marland Kitchen  Anterior wall motion abnormality noted on surface echo along with Q waves noted on EKG.  However, the anterior wall did not appear thinned out or akinetic.  She remains on losartan 12.5, Toprol-XL 12.5, and spironolactone 12.5 mg.  Escalation of GDMT was deferred today initially given recent influenza with plans to escalate in follow-up.  HTN: Blood pressure is well-controlled in the office today.  No changes were made given recent influenza diagnosis.  Remains on losartan 12.5 mg, Toprol-XL 12.5 mg, and spironolactone 12.5 mg.  HLD with statin intolerance: LDL 73 in 12/2023.  Remains on ezetimibe.  Has been referred to the lipid clinic at hospital discharge from [facility].  Will need to readdress PCSK9 inhibitor versus bempedoic acid at follow-up.  Right arm hematoma: Improving.  Good distal perfusion with palpable pulses.  Conservative therapy.  Syncope and abnormal EKG: Occurred prior to office discharge.  Suspected to be vasovagal in nature.  Transient inferior ST elevation may have been due to supply/demand mismatch and global ischemia from her bradycardia/hypotension.  Mild troponin elevation in the ED was relatively flat and felt to be reflective of  supply/demand mismatch from the above episode, or possibly some residual troponin from her recent MI.  It was not felt her EKGs, symptoms, and troponin elevation were consistent with ACS.  Episode may also have been exacerbated by recent influenza infection and element of dehydration with mild elevation in serum creatinine.  She was discharged with a 14-day event monitor.  Avoid driving/operating heavy  machinery pending further workup of syncope.      Disposition: F/u with Dr. Mariah Milling or an APP in 2 to 3 weeks.   Medication Adjustments/Labs and Tests Ordered: Current medicines are reviewed at length with the patient today.  Concerns regarding medicines are outlined above. Medication changes, Labs and Tests ordered today are summarized above and listed in the Patient Instructions accessible in Encounters.   Signed, Eula Listen, PA-C 01/13/2024 5:37 PM     [facility] [place] [address] [place], [place] [address] [phone]

## 2024-01-13 NOTE — Consult Note (Signed)
Cardiology Consultation   Patient ID: Sheila Marquez MRN: [id number]; DOB: [birth year]  Admit date: 01/13/2024 Date of Consult: 01/13/2024  PCP:  Enid Baas, MD   [facility] Providers Cardiologist:  Julien Nordmann, MD        Patient Profile:   Sheila Marquez is a 73 y.o. female with a hx of CAD with NSTEMI 2 weeks ago complicated by wire perforation of diagonal branch in the setting of PCI to the LAD, heart failure with mildly reduced ejection fraction due to ischemic cardiomyopathy, hypertension, and hyperlipidemia with statin intolerance who is being seen 01/13/2024 for the evaluation of syncope and abnormal EKG at the request of Dr. Lenard Lance.  History of Present Illness:   Sheila Marquez presented to the heart care clinic this morning for follow-up of her recent hospitalization.  She reports feeling tired since being discharged from the hospital a week and a half ago.  She attributes this to cessation of her plant-based hormone therapy leading to hot flashes that keep her up at night.  She was also diagnosed with shingles 10 days ago and influenza 2 days ago.  Despite this, she has been feeling okay from a heart standpoint, denying chest pain, shortness of breath, palpitations, and lightheadedness up until today.  She initially felt okay during her visit and had just finished seeing Mr. Shea Evans.  She was waiting for labs to be drawn when she suddenly became hot and lightheaded.  Her daughter, who was with Sheila Marquez, witnessed Sheila Marquez passed out for a minute or 2.  When she came to, she was still unable to communicate for a little while.  EKG was immediately performed in the office, demonstrating new inferior ST elevation.  Patient was also bradycardic and hypotensive.  Bradycardia and hypotension resolved spontaneously in the office.  However, given her EKG changes and syncope, she was transported by EMS to the ED.  Subsequent EKGs showed normalization of her  inferior ST segment changes.  Persistent anterior T wave inversions remained unchanged.  On my interview, Sheila Marquez reports that she feels back to her baseline.  She has not had any chest pain or dyspnea.  On further questioning, she reports similar episodes of feeling hot and getting lightheaded in the remote past, occasionally associated with transient syncope.  This has not happened for a while.  She endorses getting anxious about blood draws, including in the office today.   Past Medical History:  Diagnosis Date   Eczema    Heart murmur    Hyperlipidemia    Hypertension    Plantar fasciitis     Past Surgical History:  Procedure Laterality Date   ANAL FISSURECTOMY     ANAL SPHINCTEROTOMY     COLONOSCOPY     COLONOSCOPY WITH PROPOFOL N/A 05/26/2020   Procedure: COLONOSCOPY WITH PROPOFOL;  Surgeon: Earline Mayotte, MD;  Location: [facility] ENDOSCOPY;  Service: Endoscopy;  Laterality: N/A;   CORONARY STENT INTERVENTION N/A 12/31/2023   Procedure: CORONARY STENT INTERVENTION;  Surgeon: Yvonne Kendall, MD;  Location: [facility] INVASIVE CV LAB;  Service: Cardiovascular;  Laterality: N/A;   CORONARY ULTRASOUND/IVUS N/A 12/31/2023   Procedure: Coronary Ultrasound/IVUS;  Surgeon: Yvonne Kendall, MD;  Location: [facility] INVASIVE CV LAB;  Service: Cardiovascular;  Laterality: N/A;   KNEE ARTHROSCOPY     LEFT HEART CATH AND CORONARY ANGIOGRAPHY N/A 12/31/2023   Procedure: LEFT HEART CATH AND CORONARY ANGIOGRAPHY;  Surgeon: Yvonne Kendall, MD;  Location: [facility] INVASIVE CV LAB;  Service: Cardiovascular;  Laterality: N/A;       Inpatient Medications: Scheduled Meds:  Continuous Infusions:  PRN Meds:   Allergies:    Allergies  Allergen Reactions   Statins Other (See Comments)    Myalgia   Hydrocodone Nausea And Vomiting   Propoxyphene Nausea And Vomiting    Social History:   Social History   Socioeconomic History   Marital status: Widowed    Spouse name: Not on file   Number of  children: Not on file   Years of education: Not on file   Highest education level: Not on file  Occupational History   Not on file  Tobacco Use   Smoking status: Never   Smokeless tobacco: Never  Vaping Use   Vaping status: Never Used  Substance and Sexual Activity   Alcohol use: Yes    Comment: none last 24hrs   Drug use: Never   Sexual activity: Not Currently  Other Topics Concern   Not on file  Social History Narrative   Not on file   Social Drivers of Health   Financial Resource Strain: Low Risk  (10/20/2023)   Received from [facility]   Overall Financial Resource Strain (CARDIA)    Difficulty of Paying Living Expenses: Not hard at all  Food Insecurity: No Food Insecurity (01/01/2024)   Hunger Vital Sign    Worried About Running Out of Food in the Last Year: Never true    Ran Out of Food in the Last Year: Never true  Transportation Needs: No Transportation Needs (01/01/2024)   PRAPARE - Administrator, Civil Service (Medical): No    Lack of Transportation (Non-Medical): No  Physical Activity: Not on file  Stress: Not on file  Social Connections: Moderately Integrated (01/01/2024)   Social Connection and Isolation Panel [NHANES]    Frequency of Communication with Friends and Family: Three times a week    Frequency of Social Gatherings with Friends and Family: Three times a week    Attends Religious Services: More than 4 times per year    Active Member of Clubs or Organizations: Yes    Attends Banker Meetings: 1 to 4 times per year    Marital Status: Widowed  Intimate Partner Violence: Not At Risk (01/01/2024)   Humiliation, Afraid, Rape, and Kick questionnaire    Fear of Current or Ex-Partner: No    Emotionally Abused: No    Physically Abused: No    Sexually Abused: No    Family History:   Family History  Problem Relation Age of Onset   CAD Brother    Breast cancer Cousin        mat cousin     ROS:  Please see the  history of present illness. All other ROS reviewed and negative.     Physical Exam/Data:   Vitals:   01/13/24 0957 01/13/24 0959  BP:  (!) 144/69  Pulse:  65  Resp:  17  Temp:  98 F (36.7 C)  TempSrc:  Oral  SpO2:  96%  Weight: 63.2 kg   Height: 5\' 2"  (1.575 m)    No intake or output data in the 24 hours ending 01/13/24 1504    01/13/2024    9:57 AM 01/13/2024    8:33 AM 01/01/2024   10:14 AM  Last 3 Weights  Weight (lbs) 139 lb 5.3 oz 139 lb 6.4 oz 150 lb  Weight (kg) 63.2 kg 63.231 kg 68.04 kg     Body  mass index is 25.48 kg/m.  General:  Well nourished, well developed, in no acute distress.  Sheila Marquez is accompanied by her daughter. HEENT: normal Neck: no JVD Vascular: No carotid bruits; Distal pulses 2+ bilaterally Cardiac: Regular rate and rhythm without murmurs, rubs, or gallops. Lungs: Coarse breath sounds with scattered rhonchi.  No wheezes or crackles. Abd: soft, nontender, no hepatomegaly  Ext: no edema Musculoskeletal:  No deformities, BUE and BLE strength normal and equal Skin: warm and dry  Neuro:  CNs 2-12 intact, no focal abnormalities noted Psych:  Normal affect   EKG: Most recent EKG today at 9:59 AM demonstrates normal sinus rhythm with anterolateral T wave inversions and nonspecific ST segment changes. Telemetry:  Telemetry was personally reviewed and demonstrates: Sinus rhythm.  Relevant CV Studies: Limited TTE (01/13/2024): Moderate asymmetric LVH of the basal septal segment.  LVEF 45-50% with hypokinesis of the apical anterior, apical septal, and apical segments.  Trivial pericardial effusion without evidence of tamponade.  Overall, echo appears similar to last study on 01/01/2024.  Laboratory Data:  High Sensitivity Troponin:   Recent Labs  Lab 12/31/23 0616 12/31/23 0818 01/13/24 1013 01/13/24 1234  TROPONINIHS 7,862* 9,158* 46* 54*     Chemistry Recent Labs  Lab 01/13/24 1013  NA 137  K 3.5  CL 100  CO2 27  GLUCOSE 108*  BUN 18   CREATININE 1.08*  CALCIUM 10.9*  GFRNONAA 54*  ANIONGAP 10    Recent Labs  Lab 01/13/24 1013  PROT 6.9  ALBUMIN 3.5  AST 43*  ALT 36  ALKPHOS 46  BILITOT 0.9   Lipids No results for input(s): "CHOL", "TRIG", "HDL", "LABVLDL", "LDLCALC", "CHOLHDL" in the last 168 hours.  Hematology Recent Labs  Lab 01/13/24 1013  WBC 8.1  RBC 4.39  HGB 13.8  HCT 40.5  MCV 92.3  MCH 31.4  MCHC 34.1  RDW 13.1  PLT 285   Thyroid No results for input(s): "TSH", "FREET4" in the last 168 hours.  BNPNo results for input(s): "BNP", "PROBNP" in the last 168 hours.  DDimer No results for input(s): "DDIMER" in the last 168 hours.   Radiology/Studies:  ECHOCARDIOGRAM LIMITED Result Date: 01/13/2024    ECHOCARDIOGRAM LIMITED REPORT   Patient Name:   NEKEIDRA PEERENBOOM Symmonds Date of Exam: 01/13/2024 Medical Rec #:  [id number]         Height:       62.0 in Accession #:    [id number]        Weight:       139.3 lb Date of Birth:  June 27, 1951         BSA:          1.639 m Patient Age:    72 years          BP:           144/69 mmHg Patient Gender: F                 HR:           56 bpm. Exam Location:  [facility] Procedure: Limited Echo and Limited Color Doppler STAT ECHO Indications:     Pericardial effusion  History:         Patient has prior history of Echocardiogram examinations, most                  recent 01/01/2024. Previous Myocardial Infarction; Risk  Factors:Hypertension and Dyslipidemia. Pericardial effusion.  Sonographer:     Mikki Harbor Referring Phys:  [id number] EAVWU PADUCHOWSKI Diagnosing Phys: Yvonne Kendall MD IMPRESSIONS  1. Left ventricular ejection fraction, by estimation, is 45 to 50%. The left ventricle has mildly decreased function. The left ventricle demonstrates regional wall motion abnormalities (see scoring diagram/findings for description). There is moderate asymmetric left ventricular hypertrophy of the basal-septal segment.  2. Right ventricular systolic function is normal. The  right ventricular size is normal.  3. There is no evidence of cardiac tamponade.  4. Mild mitral valve regurgitation.  5. The aortic valve is tricuspid.  6. The inferior vena cava is normal in size with greater than 50% respiratory variability, suggesting right atrial pressure of 3 mmHg. FINDINGS  Left Ventricle: Left ventricular ejection fraction, by estimation, is 45 to 50%. The left ventricle has mildly decreased function. The left ventricle demonstrates regional wall motion abnormalities. There is moderate asymmetric left ventricular hypertrophy of the basal-septal segment.  LV Wall Scoring: The mid and distal anterior septum and apex are hypokinetic. The entire anterior wall, entire lateral wall, entire inferior wall, basal anteroseptal segment, mid inferoseptal segment, and basal inferoseptal segment are normal. Right Ventricle: The right ventricular size is normal. No increase in right ventricular wall thickness. Right ventricular systolic function is normal. Pericardium: Trivial pericardial effusion is present. There is no evidence of cardiac tamponade. Mitral Valve: Mild mitral valve regurgitation. Tricuspid Valve: The tricuspid valve is normal in structure. Aortic Valve: The aortic valve is tricuspid. Pulmonic Valve: The pulmonic valve was grossly normal. Aorta: The aortic root is normal in size and structure. Venous: The inferior vena cava is normal in size with greater than 50% respiratory variability, suggesting right atrial pressure of 3 mmHg. LEFT VENTRICLE PLAX 2D LVIDd:         4.20 cm LVIDs:         3.00 cm LV PW:         1.05 cm LV IVS:        1.50 cm LVOT diam:     1.90 cm LVOT Area:     2.84 cm  LEFT ATRIUM         Index LA diam:    3.70 cm 2.26 cm/m   AORTA Ao Root diam: 2.90 cm  SHUNTS Systemic Diam: 1.90 cm Yvonne Kendall MD Electronically signed by Yvonne Kendall MD Signature Date/Time: 01/13/2024/11:32:21 AM    Final (Updated)      Assessment and Plan:   Syncope and abnormal  EKG: I suspect syncopal episode this morning was vasovagal in nature.  Transient inferior ST elevation may have been due to supply-demand mismatch and global ischemia from her bradycardia/hypotension.  Minimal troponin elevation is relatively flat and may reflect supply-demand mismatch from today's event or some residual troponin from her recent MI.  I do not think her EKGs, symptoms, and troponins are consistent with ACS.  Episode may have been exacerbated by recent influenza infection and element of dehydration (mildly elevated creatinine could be due to -Repeat high-sensitivity troponin I 1 more time to ensure it is not rising precipitously.  If stable/slightly higher than last check, no further ischemic evaluation recommended at this time. -Anticipate 14-day event monitor at discharge (ZIO AT). -Avoid driving/operating heavy machinery pending further workup of syncope.  NSTEMI: Patient with recent NSTEMI status post PCI to the LAD complicated by perforation of small diagonal branch.  No further chest pain reported.  As above, elevated troponin may be residual from recent  NSTEMI versus supply-demand mismatch.  Echo unchanged with mildly reduced LVEF and apical wall motion abnormality. -Continue dual antiplatelet therapy with aspirin and ticagrelor. -Continue metoprolol tartrate 12.5 mg daily. -Readdress PCSK9 inhibitor versus bempedoic acid acid at follow-up given history of statin intolerance.  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  Slight bump in creatinine from baseline suggest she may even be a little bit dry. -No standing loop diuretic. -Continue current regimen of low-dose losartan, metoprolol succinate, and spironolactone.  Influenza: -Continue oseltamavir.  Disposition: If repeat troponin is stable, I think it would be reasonable for Sheila Marquez to be discharged from the ED.  Our office will arrange for 14-day event monitor to be mailed to her  home.  For questions or updates, please contact [facility] Please consult [email or web address] for contact info under [facility] Card[facility].  Signed, Yvonne Kendall, MD  01/13/2024 3:04 PM

## 2024-01-13 NOTE — ED Provider Notes (Signed)
Emergency department handoff note  Care of this patient was signed out to me at the end of the previous provider shift.  All pertinent patient information was conveyed and all questions were answered.  Patient pending repeat troponin which was downtrending.  I spoke to Dr. Okey Dupre prior to her discharge who agrees with plan for a Holter monitor to be mailed within the next 1 to 2 days and follow-up with his office as needed.  Dispo: Discharge home with cardiology follow-up   Merwyn Katos, MD 01/13/24 1739

## 2024-01-13 NOTE — ED Triage Notes (Signed)
Presents via ems from MD office  States she became "flushed and hit"  then dizzy  Had near syncopal episode   Recent cardiac stent palcement

## 2024-01-13 NOTE — Progress Notes (Signed)
*  PRELIMINARY RESULTS* Echocardiogram A Limited 2D Echocardiogram has been performed.  Carolyne Fiscal 01/13/2024, 11:20 AM

## 2024-01-23 DIAGNOSIS — E782 Mixed hyperlipidemia: Secondary | ICD-10-CM | POA: Diagnosis not present

## 2024-01-23 DIAGNOSIS — I214 Non-ST elevation (NSTEMI) myocardial infarction: Secondary | ICD-10-CM | POA: Diagnosis not present

## 2024-01-23 DIAGNOSIS — I10 Essential (primary) hypertension: Secondary | ICD-10-CM | POA: Diagnosis not present

## 2024-01-26 ENCOUNTER — Other Ambulatory Visit (HOSPITAL_COMMUNITY): Payer: Self-pay

## 2024-02-11 NOTE — Progress Notes (Signed)
 Cardiology Clinic Note   Date: 02/16/2024 ID: Kaidance, Pantoja 1951/11/04, MRN [id number]  Primary Cardiologist:  Julien Nordmann, MD  Chief Complaint   NIOMI VALENT is a 73 y.o. female who presents to the clinic today for  hospital follow up.   Patient Profile   JOYLENE WESCOTT is followed by Dr. Mariah Milling for the history outlined below.      Past medical history significant for: CAD. LHC 12/31/2023 (NSTEMI): Severe single-vessel CAD with occlusion of mid LAD.  Minimal luminal irregularities are noted in dominant LCx.  Moderate to severely reduced LV systolic function (LVEF 30 to 40%) with mid/apical anterior and apical inferior hypokinesis/akinesis.  Normal LVEDP.  Successful IVUS guided PCI to mid LAD with DES 2.25 x 30 mm.  PCI complicated by wire perforation of small branch (most likely tiny diagonal).  Perforation controlled with balloon tamponade in the proximal LAD.  Small pericardial effusion noted at the time of perforation patient remained hemodynamically stable.  Small and tortuous right radial artery not suitable for catheterization.  Procedure performed via right ulnar artery. Chronic HFmrEF/ischemic cardiomyopathy/pericardial effusion. Echo 01/01/2024: EF 45 to 50%.  Moderate asymmetric left ventricle hypertrophy of the basal-septal segment.  Severe akinesis of the left ventricular, mid-- apical anteroseptal wall, anterior wall and apical segment.  Grade I DD.  Normal RV size/function.  Small pericardial effusion localized near the right ventricle with no evidence of cardiac tamponade.  Trivial MR.  Aortic valve sclerosis without stenosis. Limited echo 01/13/2024: EF 45 to 50%.  Moderate asymmetric left ventricular hypertrophy of the basal-septal segment.  Normal RV size/function.  Trivial pericardial effusion with no evidence of cardiac tamponade.  Mild MR. Hypertension. Hyperlipidemia. LPa 01/01/2024: <8.4. Lipid panel 01/02/2024: LDL 73, HDL 80, TG 51, total  163. Syncope.  In summary, patient was admitted to [facility] on 12/31/2023 with an NSTEMI after developing symptoms of angina the night prior.  EKG showed sinus tachycardia with 1 mm of ST elevation isolated in V2 with septal T wave inversions and anterior septal Q waves.  Troponin peaked at 9158.  LHC showed severe single-vessel CAD with occlusion of the mid LAD as detailed above.  She underwent PCI with DES to mid LAD complicated by wire perforation of small branch (likely tiny diagonal) controlled with balloon tamponade in the proximal LAD.  A small pericardial effusion was noted at the time of perforation.  Serial echocardiograms performed at bedside demonstrated stable small pericardial effusion.  She remained hemodynamically stable throughout the procedure.  She was transferred to [facility] for further observation and treatment.  Repeat echo the following day showed EF 45 to 50% as detailed above.  Post cath course further complicated by right arm hematoma with good perfusion.  Patient was last seen in the office by Eula Listen, PA-C on 01/13/2024 for hospital follow-up.  Patient reported she was diagnosed with shingles on 01/02/2024 and was not prescribed antiviral therapy.  She experienced fatigue and diminished appetite and was diagnosed with flu on 01/11/2024.  She denied symptoms of angina.  Right radial hematoma was improving.  At the end of patient's follow-up visit she began to feel flushed and had a syncopal episode lasting 1 to 2 minutes.  She had been anxious about having labs drawn and kept saying she felt hot.  Initial BP 107/71, HR 52 bpm, SpO2 97%, blood glucose 137.  Repeat EKG demonstrated sinus bradycardia 52 bpm, inferior ST elevation with stable anterior T wave inversion with new T wave  inversion along V6.  Hypotension and bradycardia resolved spontaneously while in the office.  She was given 324 mg aspirin.  Repeat EKG showed NSR 71 bpm and normalization of inferior ST segments with persistent  anterior T wave inversion.  She was transported by EMS to the ED where she remained hemodynamically stable.  High-sensitivity troponin 46 with delta troponin of 54 subsequently downtrending to 46.  Renal function mildly elevated at 1.08 with baseline around 0.7.  CBC unremarkable.  Chest x-ray without evidence of acute cardiopulmonary process.  While in the emergency room she was evaluated by interventional cardiology and reported feeling back to baseline.  She reported similar episodes of feeling hot and getting lightheaded in the remote past as well as anxiety about blood draws.  Transient inferior ST elevation felt to be secondary to supply-demand mismatch and global ischemia from bradycardia/hypotension.  Minimal troponin elevation relatively flat and may reflect supply-demand mismatch from today's event or residual troponin from recent MI.  Patient was discharged from the ED the same day.     History of Present Illness    Today, patient is here alone. She denies shortness of breath, dyspnea on exertion, lower extremity edema, orthopnea or PND. No chest pain, pressure, or tightness. No palpitations. She denies any further syncopal episodes. She has recovered fully from shingles and flu. She reports she has never received the Zio monitor in the mail. She questions if she still needs to undergo outpatient monitoring. Discussed the importance of ensuring an arrhythmia did not cause her syncopal episode. Prior to MI she was doing water aerobics and participating in [employer]. She is interested in doing cardiac rehab.      ROS: All other systems reviewed and are otherwise negative except as noted in History of Present Illness.  EKGs/Labs Reviewed    EKG Interpretation Date/Time:  Monday February 16 2024 10:25:48 EST Ventricular Rate:  76 PR Interval:  130 QRS Duration:  78 QT Interval:  364 QTC Calculation: 409 R Axis:   46  Text Interpretation: Normal sinus rhythm Possible Left atrial  enlargement Low voltage QRS Septal infarct (cited on or before 31-Dec-2023) When compared with ECG of 13-Jan-2024 09:59, Serial changes of Septal infarct Present Confirmed by Carlos Levering [phone]) on 02/16/2024 10:28:44 AM   01/13/2024: ALT 36; AST 43; BUN 18; Creatinine, Ser 1.08; Potassium 3.5; Sodium 137   01/13/2024: Hemoglobin 13.8; WBC 8.1   12/31/2023: TSH 0.963    Risk Assessment/Calculations      HYPERTENSION CONTROL Vitals:   02/16/24 1030 02/16/24 1153  BP: (!) 149/88 (!) 140/80    The patient's blood pressure is elevated above target today.  In order to address the patient's elevated BP: Blood pressure will be monitored at home to determine if medication changes need to be made.      Physical Exam    VS:  BP (!) 140/80 (BP Location: Left Arm, Patient Position: Sitting, Cuff Size: Normal)   Pulse 76   Ht 5\' 2"  (1.575 m)   Wt 145 lb 3.2 oz (65.9 kg)   LMP  (LMP Unknown)   SpO2 96%   BMI 26.56 kg/m  , BMI Body mass index is 26.56 kg/m.  Orthostatic VS for the past 24 hrs (Last 3 readings):  BP- Lying Pulse- Lying BP- Sitting Pulse- Sitting BP- Standing at 0 minutes Pulse- Standing at 0 minutes BP- Standing at 3 minutes Pulse- Standing at 3 minutes  02/16/24 1031 156/82 78 149/88 79 155/77 92 164/85 89  GEN: Well nourished, well developed, in no acute distress. Neck: No JVD or carotid bruits. Cardiac:  RRR. No murmurs. No rubs or gallops.   Respiratory:  Respirations regular and unlabored. Clear to auscultation without rales, wheezing or rhonchi. GI: Soft, nontender, nondistended. Extremities: Radials/DP/PT 2+ and equal bilaterally. No clubbing or cyanosis. No edema.  Skin: Warm and dry, no rash. Neuro: Strength intact.  Assessment & Plan   CAD S/p PCI with DES to proximal LAD in the setting of NSTEMI January 2025 complicated by wire perforation of (likely) tiny diagonal and development of small pericardial effusion.  Patient denies chest pain, pressure  or tightness. She would like to participate in cardiac rehab.  -Continue aspirin, Brilinta, Toprol, Zetia.  Chronic HFmrEF/ischemic cardiomyopathy/pericardial effusion Echo 01/01/2024 showed EF 45 to 50%, wall motion abnormalities as described above in narrative history, Grade I DD, small pericardial effusion localized near the right ventricle without cardiac tamponade.  Repeat limited echo 01/13/2024 demonstrated EF 45 to 50%, moderate asymmetric LVH of basal-septal segment, trivial pericardial effusion with no evidence of cardiac tamponade.  Patient denies shortness of breath, DOE, lower extremity edema, orthopnea or PND.  Euvolemic and well compensated on exam. -Continue losartan, Toprol, spironolactone.  Hypertension BP today 149/88 on intake and 140/80 on my recheck. She does not typically check BP at home.  -Monitor home BP at least 3 times a week. Goal BP <130/80.  -Continue losartan, Toprol, spironolactone.  Hyperlipidemia/myalgias from statins LPa January 2025 <8.4.  LDL 73, not quite at goal. -Continue Zetia. -Will discuss PCSK9i or bempedoic acid at next visit.   Syncope Patient with episode of syncope at previous office visit on 01/13/2024 in the setting of shingles, flu, anxiety regarding lab draw.  Patient denies further syncopal episodes. She has recovered completely from shingles and flu. She never received 14-day ZIO. She questions if she should still do the monitor, as she feels her event was related to being sick. Discussed the importance of completing the cardiac workup and she agrees.  -Will ensure patient receives Zio.  Disposition: Reorder Zio. Return in 3 months or sooner as needed.     Cardiac Rehabilitation Eligibility Assessment  The patient is ready to start cardiac rehabilitation from a cardiac standpoint.        Signed, Etta Grandchild. Bryden Darden, DNP, NP-C

## 2024-02-12 ENCOUNTER — Ambulatory Visit: Payer: PPO

## 2024-02-16 ENCOUNTER — Encounter: Payer: Self-pay | Admitting: Student

## 2024-02-16 ENCOUNTER — Ambulatory Visit: Payer: PPO

## 2024-02-16 ENCOUNTER — Ambulatory Visit: Payer: PPO | Attending: Student | Admitting: Student

## 2024-02-16 VITALS — BP 140/80 | HR 76 | Ht 62.0 in | Wt 145.2 lb

## 2024-02-16 DIAGNOSIS — R55 Syncope and collapse: Secondary | ICD-10-CM | POA: Diagnosis not present

## 2024-02-16 DIAGNOSIS — I10 Essential (primary) hypertension: Secondary | ICD-10-CM

## 2024-02-16 DIAGNOSIS — I3139 Other pericardial effusion (noninflammatory): Secondary | ICD-10-CM | POA: Diagnosis not present

## 2024-02-16 DIAGNOSIS — I5022 Chronic systolic (congestive) heart failure: Secondary | ICD-10-CM | POA: Diagnosis not present

## 2024-02-16 DIAGNOSIS — I2510 Atherosclerotic heart disease of native coronary artery without angina pectoris: Secondary | ICD-10-CM | POA: Diagnosis not present

## 2024-02-16 NOTE — Patient Instructions (Signed)
 Medication Instructions:  No changes at this time.   *If you need a refill on your cardiac medications before your next appointment, please call your pharmacy*   Lab Work: None  If you have labs (blood work) drawn today and your tests are completely normal, you will receive your results only by: MyChart Message (if you have MyChart) OR A paper copy in the mail If you have any lab test that is abnormal or we need to change your treatment, we will call you to review the results.   Testing/Procedures: Your physician has recommended that you wear a Zio AT Live monitor for 14 days.   This monitor is a medical device that records the heart's electrical activity. Doctors most often use these monitors to diagnose arrhythmias. Arrhythmias are problems with the speed or rhythm of the heartbeat. The monitor is a small device applied to your chest. You can wear one while you do your normal daily activities. While wearing this monitor if you have any symptoms to push the button and record what you felt. Once you have worn this monitor for the period of time provider prescribed (Usually 14 days), you will return the monitor device in the postage paid box/bag. Once it is returned they will download the data collected and provide [place] with a report which the provider will then review and we will call you with those results.   Important tips:  Avoid showering during the first 24 hours of wearing the monitor. Avoid excessive sweating to help maximize wear time. Do not submerge the device, no hot tubs, and no swimming pools. Keep any lotions or oils away from the patch. After 24 hours you may shower with the patch on. Take brief showers with your back facing the shower head.  Do not remove patch once it has been placed because that will interrupt data and decrease adhesive wear time. Push the button when you have any symptoms and write down what you were feeling. Once you have completed wearing your monitor,  remove and place into box which has postage paid and place in your outgoing mailbox.  If for some reason you have misplaced your box then call our office and we can provide another box and/or mail it off for you. Keep the transmitter within 10 feet at all times.  Expect a welcome phone call within 24-48 hrs of application from Zio.  This call will include your copay information, so please answer any unknown phone calls while wearing Zio (it could also be important information about your heart) The envelope to return Zio is in the back of the transmitter. Removal instructions are on the last page of the symptom diary.  Place the patch sticky side up inside the transmitter and the symptom diary inside the envelope to return on your last wear day inside your mailbox or any USPS mailbox.    Follow-Up: At [facility], you and your health needs are our priority.  As part of our continuing mission to provide you with exceptional heart care, we have created designated Provider Care Teams.  These Care Teams include your primary Cardiologist (physician) and Advanced Practice Providers (APPs -  Physician Assistants and Nurse Practitioners) who all work together to provide you with the care you need, when you need it.   Your next appointment:   3 month(s)  Provider:   Julien Nordmann, MD or Carlos Levering, NP

## 2024-02-20 DIAGNOSIS — R55 Syncope and collapse: Secondary | ICD-10-CM | POA: Diagnosis not present

## 2024-03-29 NOTE — Progress Notes (Unsigned)
 Patient ID: Sheila Marquez                 DOB: 1951/09/27                    MRN: [id number]      HPI: Sheila Marquez is a 73 y.o. female patient referred to lipid clinic by Dr. Gasper Lloyd [facility]). PMH is significant for CAD with recent NSTEMI status post PCI/DES to the mid LAD complicated by wire perforation of a likely tiny diagonal branch on 12/31/2023, HFrEF secondary to ICM, HTN, and HLD.  Patient was referred to lipid clinic upon discharge after an NSTEMI on 12/31/23 after developing symptoms of angina the night prior. EKG showed sinus tachycardia with 1 mm of ST elevation isolated in V2 with septal T wave inversions and anteroseptal Q waves. High-sensitivity troponin trended to 9158. LHC 12/31/2023 showed severe single-vessel CAD with occlusion of the mid LAD. Minimal luminal irregularities were noted in the dominant LCx. Moderately to severely reduced LV systolic function with an EF of 30 to 35% with mid/apical anterior and apical inferior hypokinesis/akinesis. Normal LV filling pressure. She underwent successful IVUS guided PCI to the mid LAD. The mid LAD occlusion was challenging to cross and was complicated by wire perforation of a small branch (felt to most likely be a tiny diagonal). Wire perforation controlled with balloon tamponade in the proximal LAD. Small pericardial effusion was noted at the time of perforation. Serial echocardiogram performed at bedside demonstrated stable small pericardial effusion. Patient remained hemodynamically stable throughout the procedure. She was subsequently [facility]. Follow-up echo on 01/01/2024 showed an EF of 45 to 50%, moderate asymmetric LVH of the basal septal segment, grade 1 diastolic dysfunction, severe akinesis of the mid apical anteroseptal wall, anterior wall, and apical segment, normal RV systolic function and ventricular cavity size, small pericardial effusion located near the RV with no evidence of cardiac tamponade, trivial mitral  regurgitation, and an estimated right atrial pressure of 3 mmHg. Post-cath course further complicated by right arm hematoma with good perfusion.  She has had one syncopal episode since her NSTEMI requiring an ED visit on 01/13/24. At last cardiology visit 02/16/24, she denies symptoms of SOB, LEE, chest pain, palpitations, or any further syncopal episodes. She was prescribed a Zio patch for assessment of arrhythmias that potentially could have caused her syncopal episode.   Patient has been historically statin intolerant with development of myalgias. She has been on ezetimibe since 2021. Lp(a) from 12/2023 is < 8.4. Last LDL 73.  Reviewed options for lowering LDL cholesterol, including ezetimibe, PCSK-9 inhibitors, bempedoic acid and inclisiran.  Discussed mechanisms of action, dosing, side effects and potential decreases in LDL cholesterol.  Also reviewed cost information and potential options for patient assistance. Expect $47/month for Repatha with her insurance.   Current Medications: ezetimibe 10 mg  Intolerances: statins (myalgias) Risk Factors: NSTEMI, HFrEF, HTN, age > 65 LDL-C goal: < 55 mg/dl  ApoB goal: < 70 mg/dl  Diet:   Exercise: Prior to MI, doing water aerobics and going to [employer]. Interested in cardiac rehab.   Family History: CAD (brother), breast cancer (cousin)  Social History: Never smoker. No smokeless tobacco. She reports current alcohol use and does not use drugs.   Labs: Lipid Panel     Component Value Date/Time   CHOL 163 01/02/2024 0222   TRIG 51 01/02/2024 0222   HDL 80 01/02/2024 0222   CHOLHDL 2.0 01/02/2024 0222  VLDL 10 01/02/2024 0222   LDLCALC 73 01/02/2024 0222    Past Medical History:  Diagnosis Date   Eczema    Heart murmur    Hyperlipidemia    Hypertension    Plantar fasciitis     Current Outpatient Medications on File Prior to Visit  Medication Sig Dispense Refill   acetaminophen (TYLENOL) 500 MG tablet Take 500  mg by mouth every 6 (six) hours as needed for mild pain (pain score 1-3) or fever.     aspirin 81 MG chewable tablet Chew 1 tablet (81 mg total) by mouth daily. 30 tablet 12   calcium carbonate (OSCAL) 1500 (600 Ca) MG TABS tablet Take 600 mg of elemental calcium by mouth 2 (two) times daily with a meal.     cetirizine (ZYRTEC) 10 MG tablet Take 10 mg by mouth daily.     diphenhydrAMINE (BENADRYL) 25 MG tablet Take 25 mg by mouth every 6 (six) hours as needed.     docusate sodium (COLACE) 50 MG capsule Take 50 mg by mouth 2 (two) times daily as needed for mild constipation.     ezetimibe (ZETIA) 10 MG tablet Take 1 tablet by mouth daily.     glucosamine-chondroitin 500-400 MG tablet Take 1 tablet by mouth 2 (two) times daily.     losartan (COZAAR) 25 MG tablet Take 0.5 tablets (12.5 mg total) by mouth daily. 15 tablet 5   magnesium oxide (MAG-OX) 400 MG tablet Take 400 mg by mouth daily.     metoprolol succinate (TOPROL-XL) 25 MG 24 hr tablet Take 0.5 tablets (12.5 mg total) by mouth daily. 15 tablet 5   Multiple Vitamin (MULTI-VITAMIN) tablet Take 1 tablet by mouth daily.     predniSONE (DELTASONE) 10 MG tablet 40mg  x 2 days, 30mg  x 2days, 20mg  x 2days, 10mg  x 2days (Patient not taking: Reported on 02/16/2024)     psyllium (METAMUCIL) 58.6 % packet Take 1 packet by mouth 2 (two) times daily.     spironolactone (ALDACTONE) 25 MG tablet Take 0.5 tablets (12.5 mg total) by mouth daily. 15 tablet 5   ticagrelor (BRILINTA) 90 MG TABS tablet Take 1 tablet (90 mg total) by mouth 2 (two) times daily. 60 tablet 5   zinc gluconate 50 MG tablet Take 50 mg by mouth daily.     No current facility-administered medications on file prior to visit.    Allergies  Allergen Reactions   Statins Other (See Comments)    Myalgia   Hydrocodone Nausea And Vomiting   Propoxyphene Nausea And Vomiting    Assessment/Plan:  1. Hyperlipidemia -  No problem-specific Assessment & Plan notes found for this  encounter.    Thank you,  Lendon Ka, PharmD Candidate 2025 [address] [facility] Extern 03/30/24  Olene Floss, Pharm.D, BCACP, CPP [facility] [address] [place], [place] [address]  Phone: [phone]; Fax: [phone]

## 2024-03-30 ENCOUNTER — Ambulatory Visit: Payer: PPO | Attending: Cardiology | Admitting: Pharmacist

## 2024-03-30 ENCOUNTER — Telehealth: Payer: Self-pay | Admitting: Pharmacy Technician

## 2024-03-30 ENCOUNTER — Other Ambulatory Visit (HOSPITAL_COMMUNITY): Payer: Self-pay

## 2024-03-30 VITALS — BP 158/78

## 2024-03-30 DIAGNOSIS — E782 Mixed hyperlipidemia: Secondary | ICD-10-CM

## 2024-03-30 DIAGNOSIS — I10 Essential (primary) hypertension: Secondary | ICD-10-CM | POA: Diagnosis not present

## 2024-03-30 MED ORDER — LOSARTAN POTASSIUM 25 MG PO TABS: 25.0000 mg | ORAL_TABLET | Freq: Every day | ORAL | 3 refills | Status: DC

## 2024-03-30 NOTE — Telephone Encounter (Signed)
 Ran test claim for REPATHA. For a 28 day supply and the co-pay is 47.00 . PA is not needed at this time. Nothing saying this is a transition fill. This test claim was processed through [facility]- copay amounts may vary at other pharmacies due to pharmacy/plan contracts, or as the patient moves through the different stages of their insurance plan.

## 2024-03-30 NOTE — Assessment & Plan Note (Addendum)
 Assessment:  Patient with documented history of statin intolerance, on ezetimibe  LDL 73 not at stricter goal of < 55 mg/dl due to recent NSTEMI on 12/31/23 and multiple at-risk comorbid conditions Candidate for intensified lipid-lowering therapy with PCSK9 inhibitor. Patient does not qualify for Repatha grant based on yearly income.  She is a little hesitant about shots but is still motivated to reduce her LDL and CV risk.   Plan:  Initiate Repatha today. PA in process. Once approved, will inform patient via callback.  Reviewed proper injection technique of Repatha and educated on importance of continuing ezetimibe even with PCSK9. Repeat labs in 3 months

## 2024-03-30 NOTE — Assessment & Plan Note (Signed)
 Assessment: Patient's blood pressure has been elevated since her MI in January.  Losartan recently decreased from 25 to 12.5 mg inpatient due to soft BPs while in CVICU.  Home BPs in 150s/80s not at goal of < 130/80 mmHg BP in clinic today: BP 158/78 mmHg Candidate for intensified hypertension regimen with titration of losartan  Plan: Increased losartan to 25 mg daily today. Patient has already cut her 25 mg tablets in half, and will be taking two 1/2 tablets until next refill.  Reviewed appropriate BP monitoring technique with patient Advised patient to check blood pressure twice daily at home, patient to bring readings for review and her home BP monitor to next visit for validation. Follow up in 1 month

## 2024-03-30 NOTE — Patient Instructions (Addendum)
 START taking a full tablet of losartan (25 mg total).   I will submit a prior authorization for Repatha. I will call you once I hear back. Please call me at [phone] with any questions.   Repatha is a cholesterol medication that improved your body's ability to get rid of "bad cholesterol" known as LDL. It can lower your LDL up to 60%! It is an injection that is given under the skin every 2 weeks. The medication often requires a prior authorization from your insurance company. We will take care of submitting all the necessary information to your insurance company to get it approved. The most common side effects of Repatha include runny nose, symptoms of the common cold, rarely flu or flu-like symptoms, back/muscle pain in about 3-4% of the patients, and redness, pain, or bruising at the injection site. Tell your healthcare provider if you have any side effect that bothers you or that does not go away.     Your blood pressure goal is < 130/64mmHg    Important lifestyle changes to control high blood pressure  Intervention  Effect on the BP   Weight loss Weight loss is one of the most effective lifestyle changes for controlling blood pressure. If you're overweight or obese, losing even a small amount of weight can help reduce blood pressure.    Blood pressure can decrease by 1 millimeter of mercury (mmHg) with each kilogram (about 2.2 pounds) of weight lost.   Exercise regularly As a general goal, aim for 30 minutes of moderate physical activity every day.    Regular physical activity can lower blood pressure by 5 - 8 mmHg.   Eat a healthy diet Eat a diet rich in whole grains, fruits, vegetables, lean meat, and low-fat dairy products. Limit processed foods, saturated fat, and sweets.    A heart-healthy diet can lower high blood pressure by 10 mmHg.   Reduce salt (sodium) in your diet Aim for 000mg  of sodium each day. Avoid deli meats, canned food, and frozen microwave meals which  are high in sodium.     Limiting sodium can reduce blood pressure by 5 mmHg.   Limit alcohol One drink equals 12 ounces of beer, 5 ounces of wine, or 1.5 ounces of 80-proof liquor.    Limiting alcohol to < 1 drink a day for women or < 2 drinks a day for men can help lower blood pressure by about 4 mmHg.   To check your pressure at home you will need to:   Sit up in a chair, with feet flat on the floor and back supported. Do not cross your ankles or legs. Rest your left arm so that the cuff is about heart level. If the cuff goes on your upper arm, then just relax your arm on the table, arm of the chair, or your lap. If you have a wrist cuff, hold your wrist against your chest at heart level. Place the cuff snugly around your arm, about 1 inch above the crease of your elbow. The cords should be inside the groove of your elbow.  Sit quietly, with the cuff in place, for about 5 minutes. Then press the power button to start a reading. Do not talk or move while the reading is taking place.  Record your readings on a sheet of paper. Although most cuffs have a memory, it is often easier to see a pattern developing when the numbers are all in front of you.  You can repeat the reading  after 1-3 minutes if it is recommended.   Make sure your bladder is empty and you have not had caffeine or tobacco within the last 30 minutes   Always bring your blood pressure log with you to your appointments. If you have not brought your monitor in to be double checked for accuracy, please bring it to your next appointment.   You can find a list of validated (accurate) blood pressure cuffs at: validatebp.org

## 2024-03-31 ENCOUNTER — Telehealth: Payer: Self-pay | Admitting: Pharmacy Technician

## 2024-03-31 DIAGNOSIS — E782 Mixed hyperlipidemia: Secondary | ICD-10-CM

## 2024-03-31 DIAGNOSIS — I214 Non-ST elevation (NSTEMI) myocardial infarction: Secondary | ICD-10-CM

## 2024-04-02 ENCOUNTER — Telehealth: Payer: Self-pay

## 2024-04-02 MED ORDER — REPATHA SURECLICK 140 MG/ML ~~LOC~~ SOAJ: 1.0000 mL | SUBCUTANEOUS | 3 refills | Status: DC

## 2024-04-02 NOTE — Telephone Encounter (Signed)
 Called to inform patient that PA for Repatha has been approved. Patient is agreeable to $117 copay for 90-day supply. Rx sent to preferred pharmacy, [facility] [address] in [place].    Patient also had a question if she was supposed to double both her losartan and spironolactone doses. Advised patient that we increased her losartan from 12.5 mg to 25 mg daily, and her spironolactone will remain the same as 12.5 mg (1/2 tablet). Patient verbalized understanding.  Thanks!   Lendon Ka, PharmD Candidate 20[address] [facility] 04/02/24 14:45

## 2024-04-02 NOTE — Addendum Note (Signed)
 Addended by: Malena Peer D on: 04/02/2024 03:34 PM   Modules accepted: Orders

## 2024-04-04 DIAGNOSIS — R55 Syncope and collapse: Secondary | ICD-10-CM

## 2024-04-06 ENCOUNTER — Encounter: Attending: Cardiovascular Disease | Admitting: *Deleted

## 2024-04-06 ENCOUNTER — Encounter: Payer: Self-pay | Admitting: *Deleted

## 2024-04-06 DIAGNOSIS — I252 Old myocardial infarction: Secondary | ICD-10-CM | POA: Insufficient documentation

## 2024-04-06 DIAGNOSIS — Z955 Presence of coronary angioplasty implant and graft: Secondary | ICD-10-CM | POA: Insufficient documentation

## 2024-04-06 DIAGNOSIS — Z48812 Encounter for surgical aftercare following surgery on the circulatory system: Secondary | ICD-10-CM | POA: Insufficient documentation

## 2024-04-06 DIAGNOSIS — I214 Non-ST elevation (NSTEMI) myocardial infarction: Secondary | ICD-10-CM

## 2024-04-06 NOTE — Progress Notes (Signed)
 Virtual orientation call completed today. shehas an appointment on Date: 04/08/2024  for EP eval and gym Orientation.  Documentation of diagnosis can be found in [facility] 12/31/2023 .

## 2024-04-08 ENCOUNTER — Encounter

## 2024-04-08 VITALS — Ht 62.64 in | Wt 150.4 lb

## 2024-04-08 DIAGNOSIS — I252 Old myocardial infarction: Secondary | ICD-10-CM | POA: Diagnosis not present

## 2024-04-08 DIAGNOSIS — Z955 Presence of coronary angioplasty implant and graft: Secondary | ICD-10-CM | POA: Diagnosis not present

## 2024-04-08 DIAGNOSIS — Z48812 Encounter for surgical aftercare following surgery on the circulatory system: Secondary | ICD-10-CM | POA: Diagnosis not present

## 2024-04-08 DIAGNOSIS — I214 Non-ST elevation (NSTEMI) myocardial infarction: Secondary | ICD-10-CM

## 2024-04-08 NOTE — Patient Instructions (Signed)
 Patient Instructions  Patient Details  Name: Sheila Marquez MRN: [id number] Date of Birth: 1951/01/05 Referring Provider:  Antonieta Iba, MD  Below are your personal goals for exercise, nutrition, and risk factors. Our goal is to help you stay on track towards obtaining and maintaining these goals. We will be discussing your progress on these goals with you throughout the program.  Initial Exercise Prescription:  Initial Exercise Prescription - 04/08/24 1300       Date of Initial Exercise RX and Referring Provider   Date 04/08/24    Referring Provider Julien Nordmann, MD      Oxygen   Maintain Oxygen Saturation 88% or higher      Treadmill   MPH 2.7    Grade 0    Minutes 15    METs 3.07      Recumbant Bike   Level 3    RPM 50    Watts 32    Minutes 15    METs 3.48      NuStep   Level 3    SPM 80    Minutes 15    METs 3.48      REL-XR   Level 2    Speed 50    Minutes 15    METs 3.48      T5 Nustep   Level 3    SPM 80    Minutes 15    METs 3.48      Track   Laps 39    Minutes 15    METs 3.12      Prescription Details   Frequency (times per week) 3    Duration Progress to 30 minutes of continuous aerobic without signs/symptoms of physical distress      Intensity   THRR 40-80% of Max Heartrate 101-131    Ratings of Perceived Exertion 11-13    Perceived Dyspnea 0-4      Progression   Progression Continue to progress workloads to maintain intensity without signs/symptoms of physical distress.      Resistance Training   Training Prescription Yes    Weight 4 lb    Reps 10-15             Exercise Goals: Frequency: Be able to perform aerobic exercise two to three times per week in program working toward 2-5 days per week of home exercise.  Intensity: Work with a perceived exertion of 11 (fairly light) - 15 (hard) while following your exercise prescription.  We will make changes to your prescription with you as you progress through the  program.   Duration: Be able to do 30 to 45 minutes of continuous aerobic exercise in addition to a 5 minute warm-up and a 5 minute cool-down routine.   Nutrition Goals: Your personal nutrition goals will be established when you do your nutrition analysis with the dietician.  The following are general nutrition guidelines to follow: Cholesterol < 200mg /day Sodium < 1500mg /day Fiber: Women over 50 yrs - 21 grams per day  Personal Goals:  Personal Goals and Risk Factors at Admission - 04/08/24 1339       Core Components/Risk Factors/Patient Goals on Admission    Weight Management Yes;Weight Loss;Weight Maintenance    Intervention Weight Management: Develop a combined nutrition and exercise program designed to reach desired caloric intake, while maintaining appropriate intake of nutrient and fiber, sodium and fats, and appropriate energy expenditure required for the weight goal.;Weight Management: Provide education and appropriate resources to help participant work on  and attain dietary goals.;Weight Management/Obesity: Establish reasonable short term and long term weight goals.    Admit Weight 150 lb 6.4 oz (68.2 kg)    Goal Weight: Short Term 142 lb 14.4 oz (64.8 kg)    Goal Weight: Long Term 135 lb (61.2 kg)    Expected Outcomes Short Term: Continue to assess and modify interventions until short term weight is achieved;Long Term: Adherence to nutrition and physical activity/exercise program aimed toward attainment of established weight goal;Weight Maintenance: Understanding of the daily nutrition guidelines, which includes 25-35% calories from fat, 7% or less cal from saturated fats, less than 200mg  cholesterol, less than 1.5gm of sodium, & 5 or more servings of fruits and vegetables daily;Weight Loss: Understanding of general recommendations for a balanced deficit meal plan, which promotes 1-2 lb weight loss per week and includes a negative energy balance of [phone] kcal/d;Understanding  recommendations for meals to include 15-35% energy as protein, 25-35% energy from fat, 35-60% energy from carbohydrates, less than 200mg  of dietary cholesterol, 20-35 gm of total fiber daily;Understanding of distribution of calorie intake throughout the day with the consumption of 4-5 meals/snacks    Hypertension Yes    Intervention Provide education on lifestyle modifcations including regular physical activity/exercise, weight management, moderate sodium restriction and increased consumption of fresh fruit, vegetables, and low fat dairy, alcohol moderation, and smoking cessation.;Monitor prescription use compliance.    Expected Outcomes Short Term: Continued assessment and intervention until BP is < 140/6mm HG in hypertensive participants. < 130/28mm HG in hypertensive participants with diabetes, heart failure or chronic kidney disease.;Long Term: Maintenance of blood pressure at goal levels.    Lipids Yes    Intervention Provide education and support for participant on nutrition & aerobic/resistive exercise along with prescribed medications to achieve LDL 70mg , HDL >40mg .    Expected Outcomes Short Term: Participant states understanding of desired cholesterol values and is compliant with medications prescribed. Participant is following exercise prescription and nutrition guidelines.;Long Term: Cholesterol controlled with medications as prescribed, with individualized exercise RX and with personalized nutrition plan. Value goals: LDL < 70mg , HDL > 40 mg.             Tobacco Use Initial Evaluation: Social History   Tobacco Use  Smoking Status Never  Smokeless Tobacco Never    Exercise Goals and Review:  Exercise Goals     Row Name 04/08/24 1338             Exercise Goals   Increase Physical Activity Yes       Intervention Provide advice, education, support and counseling about physical activity/exercise needs.;Develop an individualized exercise prescription for aerobic and  resistive training based on initial evaluation findings, risk stratification, comorbidities and participant's personal goals.       Expected Outcomes Short Term: Attend rehab on a regular basis to increase amount of physical activity.;Long Term: Add in home exercise to make exercise part of routine and to increase amount of physical activity.;Long Term: Exercising regularly at least 3-5 days a week.       Increase Strength and Stamina Yes       Intervention Provide advice, education, support and counseling about physical activity/exercise needs.;Develop an individualized exercise prescription for aerobic and resistive training based on initial evaluation findings, risk stratification, comorbidities and participant's personal goals.       Expected Outcomes Short Term: Increase workloads from initial exercise prescription for resistance, speed, and METs.;Short Term: Perform resistance training exercises routinely during rehab and add in resistance training  at home;Long Term: Improve cardiorespiratory fitness, muscular endurance and strength as measured by increased METs and functional capacity ( )       Able to understand and use rate of perceived exertion (RPE) scale Yes       Intervention Provide education and explanation on how to use RPE scale       Expected Outcomes Short Term: Able to use RPE daily in rehab to express subjective intensity level;Long Term:  Able to use RPE to guide intensity level when exercising independently       Able to understand and use Dyspnea scale Yes       Intervention Provide education and explanation on how to use Dyspnea scale       Expected Outcomes Short Term: Able to use Dyspnea scale daily in rehab to express subjective sense of shortness of breath during exertion;Long Term: Able to use Dyspnea scale to guide intensity level when exercising independently       Knowledge and understanding of Target Heart Rate Range (THRR) Yes       Intervention Provide education and  explanation of THRR including how the numbers were predicted and where they are located for reference       Expected Outcomes Short Term: Able to state/look up THRR;Short Term: Able to use daily as guideline for intensity in rehab;Long Term: Able to use THRR to govern intensity when exercising independently       Able to check pulse independently Yes       Intervention Provide education and demonstration on how to check pulse in carotid and radial arteries.;Review the importance of being able to check your own pulse for safety during independent exercise       Expected Outcomes Short Term: Able to explain why pulse checking is important during independent exercise;Long Term: Able to check pulse independently and accurately       Understanding of Exercise Prescription Yes       Intervention Provide education, explanation, and written materials on patient's individual exercise prescription       Expected Outcomes Short Term: Able to explain program exercise prescription;Long Term: Able to explain home exercise prescription to exercise independently

## 2024-04-08 NOTE — Progress Notes (Signed)
 Cardiac Individual Treatment Plan  Patient Details  Name: Sheila Marquez MRN: [id number] Date of Birth: [birth year] Referring Provider:   Flowsheet Row Cardiac Rehab from 04/08/2024 in [facility] Cardiac and Pulmonary Rehab  Referring Provider Belva Boyden, MD       Initial Encounter Date:  Flowsheet Row Cardiac Rehab from 04/08/2024 in [facility] Cardiac and Pulmonary Rehab  Date 04/08/24       Visit Diagnosis: NSTEMI (non-ST elevation myocardial infarction) [facility])  Status post coronary artery stent placement  Patient's Home Medications on Admission:  Current Outpatient Medications:    acetaminophen (TYLENOL) 500 MG tablet, Take 500 mg by mouth every 6 (six) hours as needed for mild pain (pain score 1-3) or fever., Disp: , Rfl:    aspirin 81 MG chewable tablet, Chew 1 tablet (81 mg total) by mouth daily., Disp: 30 tablet, Rfl: 12   calcium carbonate (OSCAL) 1500 (600 Ca) MG TABS tablet, Take 600 mg of elemental calcium by mouth 2 (two) times daily with a meal., Disp: , Rfl:    cetirizine (ZYRTEC) 10 MG tablet, Take 10 mg by mouth daily., Disp: , Rfl:    diphenhydrAMINE (BENADRYL) 25 MG tablet, Take 25 mg by mouth every 6 (six) hours as needed., Disp: , Rfl:    docusate sodium (COLACE) 50 MG capsule, Take 50 mg by mouth 2 (two) times daily as needed for mild constipation., Disp: , Rfl:    Evolocumab (REPATHA SURECLICK) 140 MG/ML SOAJ, Inject 140 mg into the skin every 14 (fourteen) days., Disp: 6 mL, Rfl: 3   ezetimibe (ZETIA) 10 MG tablet, Take 1 tablet by mouth daily., Disp: , Rfl:    glucosamine-chondroitin 500-400 MG tablet, Take 1 tablet by mouth 2 (two) times daily., Disp: , Rfl:    losartan (COZAAR) 25 MG tablet, Take 1 tablet (25 mg total) by mouth daily., Disp: 100 tablet, Rfl: 3   magnesium oxide (MAG-OX) 400 MG tablet, Take 400 mg by mouth daily., Disp: , Rfl:    metoprolol succinate (TOPROL-XL) 25 MG 24 hr tablet, Take 0.5 tablets (12.5 mg total) by mouth daily., Disp: 15  tablet, Rfl: 5   Multiple Vitamin (MULTI-VITAMIN) tablet, Take 1 tablet by mouth daily., Disp: , Rfl:    psyllium (METAMUCIL) 58.6 % packet, Take 1 packet by mouth 2 (two) times daily., Disp: , Rfl:    spironolactone (ALDACTONE) 25 MG tablet, Take 0.5 tablets (12.5 mg total) by mouth daily., Disp: 15 tablet, Rfl: 5   ticagrelor (BRILINTA) 90 MG TABS tablet, Take 1 tablet (90 mg total) by mouth 2 (two) times daily., Disp: 60 tablet, Rfl: 5   zinc gluconate 50 MG tablet, Take 50 mg by mouth daily., Disp: , Rfl:   Past Medical History: Past Medical History:  Diagnosis Date   Eczema    Heart murmur    Hyperlipidemia    Hypertension    Plantar fasciitis     Tobacco Use: Social History   Tobacco Use  Smoking Status Never  Smokeless Tobacco Never    Labs: Review Flowsheet       Latest Ref Rng & Units 01/02/2024  Labs for ITP Cardiac and Pulmonary Rehab  Cholestrol 0 - 200 mg/dL 409   LDL (calc) 0 - 99 mg/dL 73   HDL-C >81 mg/dL 80   Trlycerides <191 mg/dL 51      Exercise Target Goals: Exercise Program Goal: Individual exercise prescription set using results from initial 6 min walk test and THRR while considering  patient's activity  barriers and safety.   Exercise Prescription Goal: Initial exercise prescription builds to 30-45 minutes a day of aerobic activity, 2-3 days per week.  Home exercise guidelines will be given to patient during program as part of exercise prescription that the participant will acknowledge.   Education: Aerobic Exercise: - Group verbal and visual presentation on the components of exercise prescription. Introduces F.I.T.T principle from ACSM for exercise prescriptions.  Reviews F.I.T.T. principles of aerobic exercise including progression. Written material given at graduation.   Education: Resistance Exercise: - Group verbal and visual presentation on the components of exercise prescription. Introduces F.I.T.T principle from ACSM for exercise  prescriptions  Reviews F.I.T.T. principles of resistance exercise including progression. Written material given at graduation.    Education: Exercise & Equipment Safety: - Individual verbal instruction and demonstration of equipment use and safety with use of the equipment. Flowsheet Row Cardiac Rehab from 04/08/2024 in [facility] Cardiac and Pulmonary Rehab  Date 04/08/24  Educator MB  Instruction Review Code 1- Verbalizes Understanding       Education: Exercise Physiology & General Exercise Guidelines: - Group verbal and written instruction with models to review the exercise physiology of the cardiovascular system and associated critical values. Provides general exercise guidelines with specific guidelines to those with heart or lung disease.    Education: Flexibility, Balance, Mind/Body Relaxation: - Group verbal and visual presentation with interactive activity on the components of exercise prescription. Introduces F.I.T.T principle from ACSM for exercise prescriptions. Reviews F.I.T.T. principles of flexibility and balance exercise training including progression. Also discusses the mind body connection.  Reviews various relaxation techniques to help reduce and manage stress (i.e. Deep breathing, progressive muscle relaxation, and visualization). Balance handout provided to take home. Written material given at graduation.   Activity Barriers & Risk Stratification:  Activity Barriers & Cardiac Risk Stratification - 04/08/24 1334       Activity Barriers & Cardiac Risk Stratification   Activity Barriers Joint Problems;Arthritis   bad knees (full of arthritis)   Cardiac Risk Stratification Moderate             6 Minute Walk:  6 Minute Walk     Row Name 04/08/24 1333         6 Minute Walk   Phase Initial     Distance 1470 feet     Walk Time 6 minutes     # of Rest Breaks 0     MPH 2.78     METS 3.48     RPE 13     Perceived Dyspnea  1     VO2 Peak 12.17     Symptoms No      Resting HR 71 bpm     Resting BP 146/72     Resting Oxygen Saturation  99 %     Exercise Oxygen Saturation  during 6 min walk 100 %     Max Ex. HR 117 bpm     Max Ex. BP 170/70     2 Minute Post BP 128/70              Oxygen Initial Assessment:   Oxygen Re-Evaluation:   Oxygen Discharge (Final Oxygen Re-Evaluation):   Initial Exercise Prescription:  Initial Exercise Prescription - 04/08/24 1300       Date of Initial Exercise RX and Referring Provider   Date 04/08/24    Referring Provider Julien Nordmann, MD      Oxygen   Maintain Oxygen Saturation 88% or higher  Treadmill   MPH 2.7    Grade 0    Minutes 15    METs 3.07      Recumbant Bike   Level 3    RPM 50    Watts 32    Minutes 15    METs 3.48      NuStep   Level 3    SPM 80    Minutes 15    METs 3.48      REL-XR   Level 2    Speed 50    Minutes 15    METs 3.48      T5 Nustep   Level 3    SPM 80    Minutes 15    METs 3.48      Track   Laps 39    Minutes 15    METs 3.12      Prescription Details   Frequency (times per week) 3    Duration Progress to 30 minutes of continuous aerobic without signs/symptoms of physical distress      Intensity   THRR 40-80% of Max Heartrate 101-131    Ratings of Perceived Exertion 11-13    Perceived Dyspnea 0-4      Progression   Progression Continue to progress workloads to maintain intensity without signs/symptoms of physical distress.      Resistance Training   Training Prescription Yes    Weight 4 lb    Reps 10-15             Perform Capillary Blood Glucose checks as needed.  Exercise Prescription Changes:   Exercise Prescription Changes     Row Name 04/08/24 1300             Response to Exercise   Blood Pressure (Admit) 146/72       Blood Pressure (Exercise) 170/70       Blood Pressure (Exit) 128/70       Heart Rate (Admit) 71 bpm       Heart Rate (Exercise) 117 bpm       Heart Rate (Exit) 74 bpm       Oxygen  Saturation (Admit) 99 %       Oxygen Saturation (Exercise) 100 %       Oxygen Saturation (Exit) 100 %       Rating of Perceived Exertion (Exercise) 13       Perceived Dyspnea (Exercise) 1       Symptoms none       Comments results       Duration Progress to 30 minutes of  aerobic without signs/symptoms of physical distress       Intensity THRR New         Progression   Progression Continue to progress workloads to maintain intensity without signs/symptoms of physical distress.       Average METs 3.48                Exercise Comments:   Exercise Goals and Review:   Exercise Goals     Row Name 04/08/24 1338             Exercise Goals   Increase Physical Activity Yes       Intervention Provide advice, education, support and counseling about physical activity/exercise needs.;Develop an individualized exercise prescription for aerobic and resistive training based on initial evaluation findings, risk stratification, comorbidities and participant's personal goals.       Expected Outcomes Short Term: Attend rehab on a  regular basis to increase amount of physical activity.;Long Term: Add in home exercise to make exercise part of routine and to increase amount of physical activity.;Long Term: Exercising regularly at least 3-5 days a week.       Increase Strength and Stamina Yes       Intervention Provide advice, education, support and counseling about physical activity/exercise needs.;Develop an individualized exercise prescription for aerobic and resistive training based on initial evaluation findings, risk stratification, comorbidities and participant's personal goals.       Expected Outcomes Short Term: Increase workloads from initial exercise prescription for resistance, speed, and METs.;Short Term: Perform resistance training exercises routinely during rehab and add in resistance training at home;Long Term: Improve cardiorespiratory fitness, muscular endurance and strength as  measured by increased METs and functional capacity ( )       Able to understand and use rate of perceived exertion (RPE) scale Yes       Intervention Provide education and explanation on how to use RPE scale       Expected Outcomes Short Term: Able to use RPE daily in rehab to express subjective intensity level;Long Term:  Able to use RPE to guide intensity level when exercising independently       Able to understand and use Dyspnea scale Yes       Intervention Provide education and explanation on how to use Dyspnea scale       Expected Outcomes Short Term: Able to use Dyspnea scale daily in rehab to express subjective sense of shortness of breath during exertion;Long Term: Able to use Dyspnea scale to guide intensity level when exercising independently       Knowledge and understanding of Target Heart Rate Range (THRR) Yes       Intervention Provide education and explanation of THRR including how the numbers were predicted and where they are located for reference       Expected Outcomes Short Term: Able to state/look up THRR;Short Term: Able to use daily as guideline for intensity in rehab;Long Term: Able to use THRR to govern intensity when exercising independently       Able to check pulse independently Yes       Intervention Provide education and demonstration on how to check pulse in carotid and radial arteries.;Review the importance of being able to check your own pulse for safety during independent exercise       Expected Outcomes Short Term: Able to explain why pulse checking is important during independent exercise;Long Term: Able to check pulse independently and accurately       Understanding of Exercise Prescription Yes       Intervention Provide education, explanation, and written materials on patient's individual exercise prescription       Expected Outcomes Short Term: Able to explain program exercise prescription;Long Term: Able to explain home exercise prescription to exercise  independently                Exercise Goals Re-Evaluation :   Discharge Exercise Prescription (Final Exercise Prescription Changes):  Exercise Prescription Changes - 04/08/24 1300       Response to Exercise   Blood Pressure (Admit) 146/72    Blood Pressure (Exercise) 170/70    Blood Pressure (Exit) 128/70    Heart Rate (Admit) 71 bpm    Heart Rate (Exercise) 117 bpm    Heart Rate (Exit) 74 bpm    Oxygen Saturation (Admit) 99 %    Oxygen Saturation (Exercise) 100 %    Oxygen  Saturation (Exit) 100 %    Rating of Perceived Exertion (Exercise) 13    Perceived Dyspnea (Exercise) 1    Symptoms none    Comments results    Duration Progress to 30 minutes of  aerobic without signs/symptoms of physical distress    Intensity THRR New      Progression   Progression Continue to progress workloads to maintain intensity without signs/symptoms of physical distress.    Average METs 3.48             Nutrition:  Target Goals: Understanding of nutrition guidelines, daily intake of sodium 1500mg , cholesterol 200mg , calories 30% from fat and 7% or less from saturated fats, daily to have 5 or more servings of fruits and vegetables.  Education: All About Nutrition: -Group instruction provided by verbal, written material, interactive activities, discussions, models, and posters to present general guidelines for heart healthy nutrition including fat, fiber, MyPlate, the role of sodium in heart healthy nutrition, utilization of the nutrition label, and utilization of this knowledge for meal planning. Follow up email sent as well. Written material given at graduation.   Biometrics:  Pre Biometrics - 04/08/24 1338       Pre Biometrics   Height 5' 2.64" (1.591 m)    Weight 150 lb 6.4 oz (68.2 kg)    Waist Circumference 34 inches    Hip Circumference 39.3 inches    Waist to Hip Ratio 0.87 %    BMI (Calculated) 26.95    Single Leg Stand 5.5 seconds              Nutrition  Therapy Plan and Nutrition Goals:  Nutrition Therapy & Goals - 04/08/24 1339       Personal Nutrition Goals   Nutrition Goal Will meet with RD on 5/5      Intervention Plan   Intervention Prescribe, educate and counsel regarding individualized specific dietary modifications aiming towards targeted core components such as weight, hypertension, lipid management, diabetes, heart failure and other comorbidities.;Nutrition handout(s) given to patient.    Expected Outcomes Short Term Goal: Understand basic principles of dietary content, such as calories, fat, sodium, cholesterol and nutrients.;Short Term Goal: A plan has been developed with personal nutrition goals set during dietitian appointment.;Long Term Goal: Adherence to prescribed nutrition plan.             Nutrition Assessments:  MEDIFICTS Score Key: >=70 Need to make dietary changes  40-70 Heart Healthy Diet <= 40 Therapeutic Level Cholesterol Diet   Picture Your Plate Scores: <16 Unhealthy dietary pattern with much room for improvement. 41-50 Dietary pattern unlikely to meet recommendations for good health and room for improvement. 51-60 More healthful dietary pattern, with some room for improvement.  >60 Healthy dietary pattern, although there may be some specific behaviors that could be improved.    Nutrition Goals Re-Evaluation:   Nutrition Goals Discharge (Final Nutrition Goals Re-Evaluation):   Psychosocial: Target Goals: Acknowledge presence or absence of significant depression and/or stress, maximize coping skills, provide positive support system. Participant is able to verbalize types and ability to use techniques and skills needed for reducing stress and depression.   Education: Stress, Anxiety, and Depression - Group verbal and visual presentation to define topics covered.  Reviews how body is impacted by stress, anxiety, and depression.  Also discusses healthy ways to reduce stress and to treat/manage anxiety  and depression.  Written material given at graduation.   Education: Sleep Hygiene -Provides group verbal and written instruction about how  sleep can affect your health.  Define sleep hygiene, discuss sleep cycles and impact of sleep habits. Review good sleep hygiene tips.    Initial Review & Psychosocial Screening:  Initial Psych Review & Screening - 04/06/24 1357       Initial Review   Current issues with None Identified      Family Dynamics   Good Support System? Yes   2 daughters, friends     Barriers   Psychosocial barriers to participate in program There are no identifiable barriers or psychosocial needs.      Screening Interventions   Interventions Encouraged to exercise;To provide support and resources with identified psychosocial needs;Provide feedback about the scores to participant    Expected Outcomes Short Term goal: Utilizing psychosocial counselor, staff and physician to assist with identification of specific Stressors or current issues interfering with healing process. Setting desired goal for each stressor or current issue identified.;Long Term Goal: Stressors or current issues are controlled or eliminated.;Short Term goal: Identification and review with participant of any Quality of Life or Depression concerns found by scoring the questionnaire.;Long Term goal: The participant improves quality of Life and PHQ9 Scores as seen by post scores and/or verbalization of changes             Quality of Life Scores:   Scores of 19 and below usually indicate a poorer quality of life in these areas.  A difference of  2-3 points is a clinically meaningful difference.  A difference of 2-3 points in the total score of the Quality of Life Index has been associated with significant improvement in overall quality of life, self-image, physical symptoms, and general health in studies assessing change in quality of life.  PHQ-9: Review Flowsheet       04/08/2024  Depression screen  PHQ 2/9  Decreased Interest 0  Down, Depressed, Hopeless 0  PHQ - 2 Score 0  Altered sleeping 0  Tired, decreased energy 1  Change in appetite 0  Feeling bad or failure about yourself  0  Trouble concentrating 0  Moving slowly or fidgety/restless 0  Suicidal thoughts 0  PHQ-9 Score 1  Difficult doing work/chores Not difficult at all   Interpretation of Total Score  Total Score Depression Severity:  1-4 = Minimal depression, 5-9 = Mild depression, 10-14 = Moderate depression, 15-19 = Moderately severe depression, 20-27 = Severe depression   Psychosocial Evaluation and Intervention:  Psychosocial Evaluation - 04/06/24 1413       Psychosocial Evaluation & Interventions   Interventions Encouraged to exercise with the program and follow exercise prescription    Comments There are no barriers to attending the program.  Her 2 daughters are her support. She is ready to get strted with the program.    Expected Outcomes STG attend all scheduled sessions, work on exercise progression while in the program  LTG continue with exercise progression and utilize resources found and taught at program    Continue Psychosocial Services  Follow up required by staff             Psychosocial Re-Evaluation:   Psychosocial Discharge (Final Psychosocial Re-Evaluation):   Vocational Rehabilitation: Provide vocational rehab assistance to qualifying candidates.   Vocational Rehab Evaluation & Intervention:   Education: Education Goals: Education classes will be provided on a variety of topics geared toward better understanding of heart health and risk factor modification. Participant will state understanding/return demonstration of topics presented as noted by education test scores.  Learning Barriers/Preferences:   General  Cardiac Education Topics:  AED/CPR: - Group verbal and written instruction with the use of models to demonstrate the basic use of the AED with the basic ABC's of  resuscitation.   Anatomy and Cardiac Procedures: - Group verbal and visual presentation and models provide information about basic cardiac anatomy and function. Reviews the testing methods done to diagnose heart disease and the outcomes of the test results. Describes the treatment choices: Medical Management, Angioplasty, or Coronary Bypass Surgery for treating various heart conditions including Myocardial Infarction, Angina, Valve Disease, and Cardiac Arrhythmias.  Written material given at graduation.   Medication Safety: - Group verbal and visual instruction to review commonly prescribed medications for heart and lung disease. Reviews the medication, class of the drug, and side effects. Includes the steps to properly store meds and maintain the prescription regimen.  Written material given at graduation.   Intimacy: - Group verbal instruction through game format to discuss how heart and lung disease can affect sexual intimacy. Written material given at graduation..   Know Your Numbers and Heart Failure: - Group verbal and visual instruction to discuss disease risk factors for cardiac and pulmonary disease and treatment options.  Reviews associated critical values for Overweight/Obesity, Hypertension, Cholesterol, and Diabetes.  Discusses basics of heart failure: signs/symptoms and treatments.  Introduces Heart Failure Zone chart for action plan for heart failure.  Written material given at graduation.   Infection Prevention: - Provides verbal and written material to individual with discussion of infection control including proper hand washing and proper equipment cleaning during exercise session. Flowsheet Row Cardiac Rehab from 04/08/2024 in [facility] Cardiac and Pulmonary Rehab  Date 04/08/24  Educator MB  Instruction Review Code 1- Verbalizes Understanding       Falls Prevention: - Provides verbal and written material to individual with discussion of falls prevention and  safety. Flowsheet Row Cardiac Rehab from 04/08/2024 in [facility] Cardiac and Pulmonary Rehab  Date 04/08/24  Educator MB  Instruction Review Code 1- Verbalizes Understanding       Other: -Provides group and verbal instruction on various topics (see comments)   Knowledge Questionnaire Score:   Core Components/Risk Factors/Patient Goals at Admission:  Personal Goals and Risk Factors at Admission - 04/08/24 1339       Core Components/Risk Factors/Patient Goals on Admission    Weight Management Yes;Weight Loss;Weight Maintenance    Intervention Weight Management: Develop a combined nutrition and exercise program designed to reach desired caloric intake, while maintaining appropriate intake of nutrient and fiber, sodium and fats, and appropriate energy expenditure required for the weight goal.;Weight Management: Provide education and appropriate resources to help participant work on and attain dietary goals.;Weight Management/Obesity: Establish reasonable short term and long term weight goals.    Admit Weight 150 lb 6.4 oz (68.2 kg)    Goal Weight: Short Term 142 lb 14.4 oz (64.8 kg)    Goal Weight: Long Term 135 lb (61.2 kg)    Expected Outcomes Short Term: Continue to assess and modify interventions until short term weight is achieved;Long Term: Adherence to nutrition and physical activity/exercise program aimed toward attainment of established weight goal;Weight Maintenance: Understanding of the daily nutrition guidelines, which includes 25-35% calories from fat, 7% or less cal from saturated fats, less than 200mg  cholesterol, less than 1.5gm of sodium, & 5 or more servings of fruits and vegetables daily;Weight Loss: Understanding of general recommendations for a balanced deficit meal plan, which promotes 1-2 lb weight loss per week and includes a negative energy balance of  [phone] kcal/d;Understanding recommendations for meals to include 15-35% energy as protein, 25-35% energy from fat, 35-60%  energy from carbohydrates, less than 200mg  of dietary cholesterol, 20-35 gm of total fiber daily;Understanding of distribution of calorie intake throughout the day with the consumption of 4-5 meals/snacks    Hypertension Yes    Intervention Provide education on lifestyle modifcations including regular physical activity/exercise, weight management, moderate sodium restriction and increased consumption of fresh fruit, vegetables, and low fat dairy, alcohol moderation, and smoking cessation.;Monitor prescription use compliance.    Expected Outcomes Short Term: Continued assessment and intervention until BP is < 140/59mm HG in hypertensive participants. < 130/67mm HG in hypertensive participants with diabetes, heart failure or chronic kidney disease.;Long Term: Maintenance of blood pressure at goal levels.    Lipids Yes    Intervention Provide education and support for participant on nutrition & aerobic/resistive exercise along with prescribed medications to achieve LDL 70mg , HDL >40mg .    Expected Outcomes Short Term: Participant states understanding of desired cholesterol values and is compliant with medications prescribed. Participant is following exercise prescription and nutrition guidelines.;Long Term: Cholesterol controlled with medications as prescribed, with individualized exercise RX and with personalized nutrition plan. Value goals: LDL < 70mg , HDL > 40 mg.             Education:Diabetes - Individual verbal and written instruction to review signs/symptoms of diabetes, desired ranges of glucose level fasting, after meals and with exercise. Acknowledge that pre and post exercise glucose checks will be done for 3 sessions at entry of program.   Core Components/Risk Factors/Patient Goals Review:    Core Components/Risk Factors/Patient Goals at Discharge (Final Review):    ITP Comments:  ITP Comments     Row Name 04/06/24 1412 04/08/24 1126         ITP Comments Virtual orientation  call completed today. shehas an appointment on Date: 04/08/2024  for EP eval and gym Orientation.  Documentation of diagnosis can be found in [facility] 12/31/2023 . Completed and gym orientation for cardiac rehab. Initial ITP created and sent for review to Dr. Firman Hughes, Medical Director.               Comments: Initial ITP

## 2024-04-13 DIAGNOSIS — E782 Mixed hyperlipidemia: Secondary | ICD-10-CM | POA: Diagnosis not present

## 2024-04-13 DIAGNOSIS — R7303 Prediabetes: Secondary | ICD-10-CM | POA: Diagnosis not present

## 2024-04-14 ENCOUNTER — Encounter

## 2024-04-14 DIAGNOSIS — Z955 Presence of coronary angioplasty implant and graft: Secondary | ICD-10-CM

## 2024-04-14 DIAGNOSIS — I214 Non-ST elevation (NSTEMI) myocardial infarction: Secondary | ICD-10-CM

## 2024-04-14 DIAGNOSIS — Z48812 Encounter for surgical aftercare following surgery on the circulatory system: Secondary | ICD-10-CM | POA: Diagnosis not present

## 2024-04-14 NOTE — Progress Notes (Signed)
 Daily Session Note  Patient Details  Name: Sheila Marquez MRN: [id number] Date of Birth: Dec 26, 1950 Referring Provider:   Flowsheet Row Cardiac Rehab from 04/08/2024 in [facility] Cardiac and Pulmonary Rehab  Referring Provider Belva Boyden, MD       Encounter Date: 04/14/2024  Check In:  Session Check In - 04/14/24 1134       Check-In   Supervising physician immediately available to respond to emergencies See telemetry face sheet for immediately available ER MD    Location [facility]-Cardiac & Pulmonary Rehab    Staff Present Lyell Samuel, MS, Exercise Physiologist;Maxon Conetta BS, Exercise Physiologist;Armend Hochstatter, RN, BSN, CCRP;Meredith Manson Seitz RN,BSN;[employer]    Virtual Visit No    Medication changes reported     No    Fall or balance concerns reported    No    Warm-up and Cool-down Performed on first and last piece of equipment    Resistance Training Performed Yes    VAD Patient? No    PAD/SET Patient? No      Pain Assessment   Currently in Pain? No/denies                Social History   Tobacco Use  Smoking Status Never  Smokeless Tobacco Never    Goals Met:  Exercise tolerated well Personal goals reviewed No report of concerns or symptoms today  Goals Unmet:  Not Applicable  Comments: Pt able to follow exercise prescription today without complaint.  Will continue to monitor for progression. First full day of exercise!  Patient was oriented to gym and equipment including functions, settings, policies, and procedures.  Patient's individual exercise prescription and treatment plan were reviewed.  All starting workloads were established based on the results of the 6 minute walk test done at initial orientation visit.  The plan for exercise progression was also introduced and progression will be customized based on patient's performance and goals.    Dr. Firman Hughes is Medical Director for [facility] Cardiac Rehabilitation.  Dr. Fuad Aleskerov  is Medical Director for [facility] Pulmonary Rehabilitation.

## 2024-04-15 DIAGNOSIS — E782 Mixed hyperlipidemia: Secondary | ICD-10-CM | POA: Diagnosis not present

## 2024-04-15 DIAGNOSIS — R7303 Prediabetes: Secondary | ICD-10-CM | POA: Diagnosis not present

## 2024-04-16 ENCOUNTER — Encounter: Admitting: *Deleted

## 2024-04-16 DIAGNOSIS — I214 Non-ST elevation (NSTEMI) myocardial infarction: Secondary | ICD-10-CM

## 2024-04-16 DIAGNOSIS — Z955 Presence of coronary angioplasty implant and graft: Secondary | ICD-10-CM

## 2024-04-16 DIAGNOSIS — Z48812 Encounter for surgical aftercare following surgery on the circulatory system: Secondary | ICD-10-CM | POA: Diagnosis not present

## 2024-04-16 NOTE — Progress Notes (Signed)
 Daily Session Note  Patient Details  Name: Sheila Marquez MRN: [id number] Date of Birth: [birth year] Referring Provider:   Flowsheet Row Cardiac Rehab from 04/08/2024 in [facility] Cardiac and Pulmonary Rehab  Referring Provider Belva Boyden, MD       Encounter Date: 04/16/2024  Check In:  Session Check In - 04/16/24 1125       Check-In   Supervising physician immediately available to respond to emergencies See telemetry face sheet for immediately available ER MD    Location [facility]-Cardiac & Pulmonary Rehab    Staff Present Maxon Conetta BS, Exercise Physiologist;Noah Tickle, BS, Exercise Physiologist;Tristram Milian, RN, BSN, CCRP;Joseph Hood RCP,RRT,BSRT    Virtual Visit No    Medication changes reported     No    Fall or balance concerns reported    No    Warm-up and Cool-down Performed on first and last piece of equipment    Resistance Training Performed Yes    VAD Patient? No    PAD/SET Patient? No      Pain Assessment   Currently in Pain? No/denies                Social History   Tobacco Use  Smoking Status Never  Smokeless Tobacco Never    Goals Met:  Independence with exercise equipment Exercise tolerated well No report of concerns or symptoms today  Goals Unmet:  Not Applicable  Comments: Pt able to follow exercise prescription today without complaint.  Will continue to monitor for progression.    Dr. Firman Hughes is Medical Director for [facility] Cardiac Rehabilitation.  Dr. Fuad Aleskerov is Medical Director for [facility] Pulmonary Rehabilitation.

## 2024-04-19 ENCOUNTER — Encounter: Admitting: *Deleted

## 2024-04-19 DIAGNOSIS — Z955 Presence of coronary angioplasty implant and graft: Secondary | ICD-10-CM

## 2024-04-19 DIAGNOSIS — Z48812 Encounter for surgical aftercare following surgery on the circulatory system: Secondary | ICD-10-CM | POA: Diagnosis not present

## 2024-04-19 DIAGNOSIS — I214 Non-ST elevation (NSTEMI) myocardial infarction: Secondary | ICD-10-CM

## 2024-04-19 NOTE — Progress Notes (Signed)
 Daily Session Note  Patient Details  Name: Sheila Marquez MRN: [id number] Date of Birth: 1951/09/18 Referring Provider:   Flowsheet Row Cardiac Rehab from 04/08/2024 in [facility] Cardiac and Pulmonary Rehab  Referring Provider Belva Boyden, MD       Encounter Date: 04/19/2024  Check In:  Session Check In - 04/19/24 1137       Check-In   Supervising physician immediately available to respond to emergencies See telemetry face sheet for immediately available ER MD    Location [facility]-Cardiac & Pulmonary Rehab    Staff Present Maud Sorenson, RN, BSN, CCRP;Meredith Manson Seitz RN,BSN;Joseph Hood RCP,RRT,BSRT;Maxon [place] BS, Exercise Physiologist;Kelly Sabra Cramp BS, ACSM CEP, Exercise Physiologist    Virtual Visit No    Medication changes reported     No    Fall or balance concerns reported    No    Warm-up and Cool-down Performed on first and last piece of equipment    Resistance Training Performed Yes    VAD Patient? No    PAD/SET Patient? No      Pain Assessment   Currently in Pain? No/denies                Social History   Tobacco Use  Smoking Status Never  Smokeless Tobacco Never    Goals Met:  Independence with exercise equipment Exercise tolerated well No report of concerns or symptoms today  Goals Unmet:  Not Applicable  Comments: Pt able to follow exercise prescription today without complaint.  Will continue to monitor for progression.    Dr. Firman Hughes is Medical Director for [facility] Cardiac Rehabilitation.  Dr. Fuad Aleskerov is Medical Director for [facility] Pulmonary Rehabilitation.

## 2024-04-20 DIAGNOSIS — E782 Mixed hyperlipidemia: Secondary | ICD-10-CM | POA: Diagnosis not present

## 2024-04-20 DIAGNOSIS — I214 Non-ST elevation (NSTEMI) myocardial infarction: Secondary | ICD-10-CM | POA: Diagnosis not present

## 2024-04-20 DIAGNOSIS — I10 Essential (primary) hypertension: Secondary | ICD-10-CM | POA: Diagnosis not present

## 2024-04-20 DIAGNOSIS — Z0000 Encounter for general adult medical examination without abnormal findings: Secondary | ICD-10-CM | POA: Diagnosis not present

## 2024-04-20 DIAGNOSIS — R7303 Prediabetes: Secondary | ICD-10-CM | POA: Diagnosis not present

## 2024-04-21 ENCOUNTER — Encounter: Admitting: *Deleted

## 2024-04-21 DIAGNOSIS — Z48812 Encounter for surgical aftercare following surgery on the circulatory system: Secondary | ICD-10-CM | POA: Diagnosis not present

## 2024-04-21 DIAGNOSIS — Z955 Presence of coronary angioplasty implant and graft: Secondary | ICD-10-CM

## 2024-04-21 DIAGNOSIS — I214 Non-ST elevation (NSTEMI) myocardial infarction: Secondary | ICD-10-CM

## 2024-04-21 NOTE — Progress Notes (Signed)
 Daily Session Note  Patient Details  Name: SHAKEYA RAUDALES MRN: [id number] Date of Birth: 01-Jan-1951 Referring Provider:   Flowsheet Row Cardiac Rehab from 04/08/2024 in [facility] Cardiac and Pulmonary Rehab  Referring Provider Belva Boyden, MD       Encounter Date: 04/21/2024  Check In:  Session Check In - 04/21/24 1435       Check-In   Supervising physician immediately available to respond to emergencies See telemetry face sheet for immediately available ER MD    Location [facility]-Cardiac & Pulmonary Rehab    Staff Present Maxon Conetta BS, Exercise Physiologist;Noah Tickle, BS, Exercise Physiologist;Joseph Hood RCP,RRT,BSRT;Maud Sorenson, RN, BSN, CCRP    Virtual Visit No    Medication changes reported     No    Fall or balance concerns reported    No    Warm-up and Cool-down Performed on first and last piece of equipment    Resistance Training Performed Yes    VAD Patient? No    PAD/SET Patient? No      Pain Assessment   Currently in Pain? No/denies                Social History   Tobacco Use  Smoking Status Never  Smokeless Tobacco Never    Goals Met:  Independence with exercise equipment Exercise tolerated well No report of concerns or symptoms today  Goals Unmet:  Not Applicable  Comments: Pt able to follow exercise prescription today without complaint.  Will continue to monitor for progression.    Dr. Firman Hughes is Medical Director for [facility] Cardiac Rehabilitation.  Dr. Fuad Aleskerov is Medical Director for [facility] Pulmonary Rehabilitation.

## 2024-04-23 ENCOUNTER — Encounter: Attending: Cardiovascular Disease | Admitting: *Deleted

## 2024-04-23 ENCOUNTER — Telehealth: Payer: Self-pay | Admitting: Cardiovascular Disease

## 2024-04-23 DIAGNOSIS — I214 Non-ST elevation (NSTEMI) myocardial infarction: Secondary | ICD-10-CM | POA: Diagnosis not present

## 2024-04-23 DIAGNOSIS — Z955 Presence of coronary angioplasty implant and graft: Secondary | ICD-10-CM | POA: Insufficient documentation

## 2024-04-23 NOTE — Telephone Encounter (Signed)
*  STAT* If patient is at the pharmacy, call can be transferred to refill team.   1. Which medications need to be refilled? (please list name of each medication and dose if known) metoprolol  succinate (TOPROL -XL) 25 MG 24 hr tablet    2. Would you like to learn more about the convenience, safety, & potential cost savings by using the [facility]?    3. Are you open to using the [facility] (Type [facility]. ).   4. Which pharmacy/location (including street and city if local pharmacy) is medication to be sent to? [facility] [id number] - [place], [place] - [address]    5. Do they need a 30 day or 90 day supply? 90 day

## 2024-04-23 NOTE — Progress Notes (Signed)
 Daily Session Note  Patient Details  Name: Sheila Marquez MRN: [id number] Date of Birth: [birth year] Referring Provider:   Flowsheet Row Cardiac Rehab from 04/08/2024 in [facility] Cardiac and Pulmonary Rehab  Referring Provider Belva Boyden, MD       Encounter Date: 04/23/2024  Check In:  Session Check In - 04/23/24 1135       Check-In   Supervising physician immediately available to respond to emergencies See telemetry face sheet for immediately available ER MD    Location [facility]-Cardiac & Pulmonary Rehab    Staff Present Maxon Conetta BS, Exercise Physiologist;Noah Tickle, BS, Exercise Physiologist;Harles Evetts, RN, BSN, CCRP;Joseph Hood RCP,RRT,BSRT    Virtual Visit No    Medication changes reported     No    Fall or balance concerns reported    No    Warm-up and Cool-down Performed on first and last piece of equipment    Resistance Training Performed Yes    VAD Patient? No    PAD/SET Patient? No      Pain Assessment   Currently in Pain? No/denies                Social History   Tobacco Use  Smoking Status Never  Smokeless Tobacco Never    Goals Met:  Independence with exercise equipment Exercise tolerated well No report of concerns or symptoms today  Goals Unmet:  Not Applicable  Comments: Pt able to follow exercise prescription today without complaint.  Will continue to monitor for progression.    Dr. Firman Hughes is Medical Director for [facility] Cardiac Rehabilitation.  Dr. Fuad Aleskerov is Medical Director for [facility] Pulmonary Rehabilitation.

## 2024-04-26 ENCOUNTER — Encounter

## 2024-04-26 ENCOUNTER — Other Ambulatory Visit (HOSPITAL_COMMUNITY): Payer: Self-pay | Admitting: Physician Assistant

## 2024-04-26 ENCOUNTER — Encounter: Admitting: *Deleted

## 2024-04-26 ENCOUNTER — Other Ambulatory Visit (HOSPITAL_COMMUNITY): Payer: Self-pay

## 2024-04-26 DIAGNOSIS — I214 Non-ST elevation (NSTEMI) myocardial infarction: Secondary | ICD-10-CM | POA: Diagnosis not present

## 2024-04-26 DIAGNOSIS — Z955 Presence of coronary angioplasty implant and graft: Secondary | ICD-10-CM

## 2024-04-26 MED ORDER — METOPROLOL SUCCINATE ER 25 MG PO TB24
12.5000 mg | ORAL_TABLET | Freq: Every day | ORAL | 0 refills | Status: DC
  Filled 2024-04-26: qty 15, 30d supply, fill #0

## 2024-04-26 NOTE — Progress Notes (Signed)
 Daily Session Note  Patient Details  Name: Sheila Marquez MRN: [id number] Date of Birth: 1951-03-06 Referring Provider:   Flowsheet Row Cardiac Rehab from 04/08/2024 in [facility] Cardiac and Pulmonary Rehab  Referring Provider Belva Boyden, MD       Encounter Date: 04/26/2024  Check In:  Session Check In - 04/26/24 1121       Check-In   Supervising physician immediately available to respond to emergencies See telemetry face sheet for immediately available ER MD    Location [facility]-Cardiac & Pulmonary Rehab    Staff Present Maxon Beauford Bounds, Exercise Physiologist;Joseph Hood RCP,RRT,BSRT;Jamison Yuhasz Manson Seitz RN,BSN;Kelly Frankton BS, ACSM CEP, Exercise Physiologist    Virtual Visit No    Medication changes reported     No    Fall or balance concerns reported    No    Warm-up and Cool-down Performed on first and last piece of equipment    Resistance Training Performed Yes    VAD Patient? No    PAD/SET Patient? No      Pain Assessment   Currently in Pain? No/denies                Social History   Tobacco Use  Smoking Status Never  Smokeless Tobacco Never    Goals Met:  Independence with exercise equipment Exercise tolerated well No report of concerns or symptoms today Strength training completed today  Goals Unmet:  Not Applicable  Comments: Pt able to follow exercise prescription today without complaint.  Will continue to monitor for progression.    Dr. Firman Hughes is Medical Director for [facility] Cardiac Rehabilitation.  Dr. Fuad Aleskerov is Medical Director for [facility] Pulmonary Rehabilitation.

## 2024-04-26 NOTE — Progress Notes (Signed)
 Assessment start time: 9:54 AM  Digestive issues/concerns: no known food allergies   24-hours Recall: B: Ezekiel muffin, cream cheese OR greek yogurt blueberries, walnuts OR toast and jelly L: salad OR PBJ sandwich D: salmon/grilled chicken with salad OR hamburger   Education r/t nutrition plan Sheila Marquez reports she was eating eggs and bacon, ice cream and other food that were not heart healthy but has been working on making changes and feels she is doing much better. Spoke with her today about what makes foods heart healthy. She is quite knowledgeable of heart healthy eating. Provided her with a guideline limit of less than 12g saturated fat per day and less than 1500mg  Sodium per day. Reviewed her recall, she is eating 3 times per day sometimes has a snack. She has been doing better with eating more lean meats and veggies. She has several veggies growing in her garden. She uses whole grain bread. Reviewed mediterranean diet handout. Educated on types of fats, sources, and how to read labels. Reviewed several labels together and brainstormed meals and snacks with foods she likes and will eat.    Goal 1: Eat 15-30gProtein and 30-60gCarbs at each meal. Goal 2: Read labels and reduce sodium intake to below 2300mg . Ideally 1500mg  per day.  Goal 3: Reduce saturated fat, less than 12g per day. Replace bad fats for more heart healthy fats.   End time 10:34 AM

## 2024-04-27 NOTE — Telephone Encounter (Signed)
 Pt called in asking about this refill. She wants it sent to [facility] for a full 90 day supply.    metoprolol  succinate (TOPROL -XL) 25 MG 24 hr tablet   [facility] [id number] [place], [place] - [address] Phone: [phone]  Fax: [phone]

## 2024-04-28 ENCOUNTER — Encounter: Admitting: *Deleted

## 2024-04-28 ENCOUNTER — Encounter: Payer: Self-pay | Admitting: *Deleted

## 2024-04-28 DIAGNOSIS — I214 Non-ST elevation (NSTEMI) myocardial infarction: Secondary | ICD-10-CM

## 2024-04-28 DIAGNOSIS — Z955 Presence of coronary angioplasty implant and graft: Secondary | ICD-10-CM

## 2024-04-28 NOTE — Telephone Encounter (Signed)
 This is a Educational psychologist pt

## 2024-04-28 NOTE — Progress Notes (Signed)
 Daily Session Note  Patient Details  Name: Sheila Marquez MRN: [id number] Date of Birth: Apr 14, 1951 Referring Provider:   Flowsheet Row Cardiac Rehab from 04/08/2024 in [facility] Cardiac and Pulmonary Rehab  Referring Provider Belva Boyden, MD       Encounter Date: 04/28/2024  Check In:  Session Check In - 04/28/24 1113       Check-In   Supervising physician immediately available to respond to emergencies See telemetry face sheet for immediately available ER MD    Location [facility]-Cardiac & Pulmonary Rehab    Staff Present Balinda Level, BS, RRT, CPFT;Maxon Conetta BS, Exercise Physiologist;Joseph Hood RCP,RRT,BSRT;Meredith Craven RN,BSN;Lucyann Romano, RN, BSN, CCRP    Virtual Visit No    Medication changes reported     No    Fall or balance concerns reported    No    Warm-up and Cool-down Performed on first and last piece of equipment    Resistance Training Performed Yes    VAD Patient? No    PAD/SET Patient? No      Pain Assessment   Currently in Pain? No/denies                Social History   Tobacco Use  Smoking Status Never  Smokeless Tobacco Never    Goals Met:  Independence with exercise equipment Exercise tolerated well No report of concerns or symptoms today  Goals Unmet:  Not Applicable  Comments: Pt able to follow exercise prescription today without complaint.  Will continue to monitor for progression.    Dr. Firman Hughes is Medical Director for [facility] Cardiac Rehabilitation.  Dr. Fuad Aleskerov is Medical Director for [facility] Pulmonary Rehabilitation.

## 2024-04-28 NOTE — Progress Notes (Signed)
 Cardiac Individual Treatment Plan  Patient Details  Name: Sheila Marquez MRN: [id number] Date of Birth: 1951/09/07 Referring Provider:   Flowsheet Row Cardiac Rehab from 04/08/2024 in [facility] Cardiac and Pulmonary Rehab  Referring Provider Belva Boyden, MD       Initial Encounter Date:  Flowsheet Row Cardiac Rehab from 04/08/2024 in [facility] Cardiac and Pulmonary Rehab  Date 04/08/24       Visit Diagnosis: NSTEMI (non-ST elevation myocardial infarction) [facility])  Status post coronary artery stent placement  Patient's Home Medications on Admission:  Current Outpatient Medications:    acetaminophen  (TYLENOL ) 500 MG tablet, Take 500 mg by mouth every 6 (six) hours as needed for mild pain (pain score 1-3) or fever., Disp: , Rfl:    aspirin  81 MG chewable tablet, Chew 1 tablet (81 mg total) by mouth daily., Disp: 30 tablet, Rfl: 12   calcium carbonate (OSCAL) 1500 (600 Ca) MG TABS tablet, Take 600 mg of elemental calcium by mouth 2 (two) times daily with a meal., Disp: , Rfl:    cetirizine (ZYRTEC) 10 MG tablet, Take 10 mg by mouth daily., Disp: , Rfl:    diphenhydrAMINE (BENADRYL) 25 MG tablet, Take 25 mg by mouth every 6 (six) hours as needed., Disp: , Rfl:    docusate sodium (COLACE) 50 MG capsule, Take 50 mg by mouth 2 (two) times daily as needed for mild constipation., Disp: , Rfl:    Evolocumab  (REPATHA  SURECLICK) 140 MG/ML SOAJ, Inject 140 mg into the skin every 14 (fourteen) days., Disp: 6 mL, Rfl: 3   ezetimibe  (ZETIA ) 10 MG tablet, Take 1 tablet by mouth daily., Disp: , Rfl:    glucosamine-chondroitin 500-400 MG tablet, Take 1 tablet by mouth 2 (two) times daily., Disp: , Rfl:    losartan  (COZAAR ) 25 MG tablet, Take 1 tablet (25 mg total) by mouth daily., Disp: 100 tablet, Rfl: 3   magnesium oxide (MAG-OX) 400 MG tablet, Take 400 mg by mouth daily., Disp: , Rfl:    metoprolol  succinate (TOPROL -XL) 25 MG 24 hr tablet, Take 0.5 tablets (12.5 mg total) by mouth daily., Disp: 15  tablet, Rfl: 0   Multiple Vitamin (MULTI-VITAMIN) tablet, Take 1 tablet by mouth daily., Disp: , Rfl:    psyllium (METAMUCIL) 58.6 % packet, Take 1 packet by mouth 2 (two) times daily., Disp: , Rfl:    spironolactone  (ALDACTONE ) 25 MG tablet, Take 0.5 tablets (12.5 mg total) by mouth daily., Disp: 15 tablet, Rfl: 5   ticagrelor  (BRILINTA ) 90 MG TABS tablet, Take 1 tablet (90 mg total) by mouth 2 (two) times daily., Disp: 60 tablet, Rfl: 5   zinc gluconate 50 MG tablet, Take 50 mg by mouth daily., Disp: , Rfl:   Past Medical History: Past Medical History:  Diagnosis Date   Eczema    Heart murmur    Hyperlipidemia    Hypertension    Plantar fasciitis     Tobacco Use: Social History   Tobacco Use  Smoking Status Never  Smokeless Tobacco Never    Labs: Review Flowsheet       Latest Ref Rng & Units 01/02/2024  Labs for ITP Cardiac and Pulmonary Rehab  Cholestrol 0 - 200 mg/dL 409   LDL (calc) 0 - 99 mg/dL 73   HDL-C >81 mg/dL 80   Trlycerides <191 mg/dL 51      Exercise Target Goals: Exercise Program Goal: Individual exercise prescription set using results from initial 6 min walk test and THRR while considering  patient's activity  barriers and safety.   Exercise Prescription Goal: Initial exercise prescription builds to 30-45 minutes a day of aerobic activity, 2-3 days per week.  Home exercise guidelines will be given to patient during program as part of exercise prescription that the participant will acknowledge.   Education: Aerobic Exercise: - Group verbal and visual presentation on the components of exercise prescription. Introduces F.I.T.T principle from ACSM for exercise prescriptions.  Reviews F.I.T.T. principles of aerobic exercise including progression. Written material given at graduation.   Education: Resistance Exercise: - Group verbal and visual presentation on the components of exercise prescription. Introduces F.I.T.T principle from ACSM for exercise  prescriptions  Reviews F.I.T.T. principles of resistance exercise including progression. Written material given at graduation.    Education: Exercise & Equipment Safety: - Individual verbal instruction and demonstration of equipment use and safety with use of the equipment. Flowsheet Row Cardiac Rehab from 04/28/2024 in [facility] Cardiac and Pulmonary Rehab  Date 04/08/24  Educator MB  Instruction Review Code 1- Verbalizes Understanding       Education: Exercise Physiology & General Exercise Guidelines: - Group verbal and written instruction with models to review the exercise physiology of the cardiovascular system and associated critical values. Provides general exercise guidelines with specific guidelines to those with heart or lung disease.    Education: Flexibility, Balance, Mind/Body Relaxation: - Group verbal and visual presentation with interactive activity on the components of exercise prescription. Introduces F.I.T.T principle from ACSM for exercise prescriptions. Reviews F.I.T.T. principles of flexibility and balance exercise training including progression. Also discusses the mind body connection.  Reviews various relaxation techniques to help reduce and manage stress (i.e. Deep breathing, progressive muscle relaxation, and visualization). Balance handout provided to take home. Written material given at graduation.   Activity Barriers & Risk Stratification:  Activity Barriers & Cardiac Risk Stratification - 04/08/24 1334       Activity Barriers & Cardiac Risk Stratification   Activity Barriers Joint Problems;Arthritis   bad knees (full of arthritis)   Cardiac Risk Stratification Moderate             6 Minute Walk:  6 Minute Walk     Row Name 04/08/24 1333         6 Minute Walk   Phase Initial     Distance 1470 feet     Walk Time 6 minutes     # of Rest Breaks 0     MPH 2.78     METS 3.48     RPE 13     Perceived Dyspnea  1     VO2 Peak 12.17     Symptoms No      Resting HR 71 bpm     Resting BP 146/72     Resting Oxygen Saturation  99 %     Exercise Oxygen Saturation  during 6 min walk 100 %     Max Ex. HR 117 bpm     Max Ex. BP 170/70     2 Minute Post BP 128/70              Oxygen Initial Assessment:   Oxygen Re-Evaluation:   Oxygen Discharge (Final Oxygen Re-Evaluation):   Initial Exercise Prescription:  Initial Exercise Prescription - 04/08/24 1300       Date of Initial Exercise RX and Referring Provider   Date 04/08/24    Referring Provider Belva Boyden, MD      Oxygen   Maintain Oxygen Saturation 88% or higher  Treadmill   MPH 2.7    Grade 0    Minutes 15    METs 3.07      Recumbant Bike   Level 3    RPM 50    Watts 32    Minutes 15    METs 3.48      NuStep   Level 3    SPM 80    Minutes 15    METs 3.48      REL-XR   Level 2    Speed 50    Minutes 15    METs 3.48      T5 Nustep   Level 3    SPM 80    Minutes 15    METs 3.48      Track   Laps 39    Minutes 15    METs 3.12      Prescription Details   Frequency (times per week) 3    Duration Progress to 30 minutes of continuous aerobic without signs/symptoms of physical distress      Intensity   THRR 40-80% of Max Heartrate 101-131    Ratings of Perceived Exertion 11-13    Perceived Dyspnea 0-4      Progression   Progression Continue to progress workloads to maintain intensity without signs/symptoms of physical distress.      Resistance Training   Training Prescription Yes    Weight 4 lb    Reps 10-15             Perform Capillary Blood Glucose checks as needed.  Exercise Prescription Changes:   Exercise Prescription Changes     Row Name 04/08/24 1300 04/27/24 1400           Response to Exercise   Blood Pressure (Admit) 146/72 122/70      Blood Pressure (Exercise) 170/70 162/78      Blood Pressure (Exit) 128/70 102/60      Heart Rate (Admit) 71 bpm 88 bpm      Heart Rate (Exercise) 117 bpm 125 bpm       Heart Rate (Exit) 74 bpm 85 bpm      Oxygen Saturation (Admit) 99 % --      Oxygen Saturation (Exercise) 100 % --      Oxygen Saturation (Exit) 100 % --      Rating of Perceived Exertion (Exercise) 13 14      Perceived Dyspnea (Exercise) 1 0      Symptoms none none      Comments results first 2 weeks of exercise      Duration Progress to 30 minutes of  aerobic without signs/symptoms of physical distress Progress to 30 minutes of  aerobic without signs/symptoms of physical distress      Intensity THRR New THRR unchanged        Progression   Progression Continue to progress workloads to maintain intensity without signs/symptoms of physical distress. Continue to progress workloads to maintain intensity without signs/symptoms of physical distress.      Average METs 3.48 2.75        Resistance Training   Training Prescription -- Yes      Weight -- 4      Reps -- 10-15        Interval Training   Interval Training -- No        Treadmill   MPH -- 2.8      Grade -- 0      Minutes -- 15  METs -- 3.14        NuStep   Level -- 3      Minutes -- 15      METs -- 3        T5 Nustep   Level -- 3      Minutes -- 15      METs -- 2.4        Oxygen   Maintain Oxygen Saturation -- 88% or higher               Exercise Comments:   Exercise Comments     Row Name 04/14/24 1136           Exercise Comments First full day of exercise!  Patient was oriented to gym and equipment including functions, settings, policies, and procedures.  Patient's individual exercise prescription and treatment plan were reviewed.  All starting workloads were established based on the results of the 6 minute walk test done at initial orientation visit.  The plan for exercise progression was also introduced and progression will be customized based on patient's performance and goals.                Exercise Goals and Review:   Exercise Goals     Row Name 04/08/24 1338              Exercise Goals   Increase Physical Activity Yes       Intervention Provide advice, education, support and counseling about physical activity/exercise needs.;Develop an individualized exercise prescription for aerobic and resistive training based on initial evaluation findings, risk stratification, comorbidities and participant's personal goals.       Expected Outcomes Short Term: Attend rehab on a regular basis to increase amount of physical activity.;Long Term: Add in home exercise to make exercise part of routine and to increase amount of physical activity.;Long Term: Exercising regularly at least 3-5 days a week.       Increase Strength and Stamina Yes       Intervention Provide advice, education, support and counseling about physical activity/exercise needs.;Develop an individualized exercise prescription for aerobic and resistive training based on initial evaluation findings, risk stratification, comorbidities and participant's personal goals.       Expected Outcomes Short Term: Increase workloads from initial exercise prescription for resistance, speed, and METs.;Short Term: Perform resistance training exercises routinely during rehab and add in resistance training at home;Long Term: Improve cardiorespiratory fitness, muscular endurance and strength as measured by increased METs and functional capacity ( )       Able to understand and use rate of perceived exertion (RPE) scale Yes       Intervention Provide education and explanation on how to use RPE scale       Expected Outcomes Short Term: Able to use RPE daily in rehab to express subjective intensity level;Long Term:  Able to use RPE to guide intensity level when exercising independently       Able to understand and use Dyspnea scale Yes       Intervention Provide education and explanation on how to use Dyspnea scale       Expected Outcomes Short Term: Able to use Dyspnea scale daily in rehab to express subjective sense of shortness of breath  during exertion;Long Term: Able to use Dyspnea scale to guide intensity level when exercising independently       Knowledge and understanding of Target Heart Rate Range (THRR) Yes       Intervention Provide education and explanation  of THRR including how the numbers were predicted and where they are located for reference       Expected Outcomes Short Term: Able to state/look up THRR;Short Term: Able to use daily as guideline for intensity in rehab;Long Term: Able to use THRR to govern intensity when exercising independently       Able to check pulse independently Yes       Intervention Provide education and demonstration on how to check pulse in carotid and radial arteries.;Review the importance of being able to check your own pulse for safety during independent exercise       Expected Outcomes Short Term: Able to explain why pulse checking is important during independent exercise;Long Term: Able to check pulse independently and accurately       Understanding of Exercise Prescription Yes       Intervention Provide education, explanation, and written materials on patient's individual exercise prescription       Expected Outcomes Short Term: Able to explain program exercise prescription;Long Term: Able to explain home exercise prescription to exercise independently                Exercise Goals Re-Evaluation :  Exercise Goals Re-Evaluation     Row Name 04/14/24 1135 04/27/24 1440           Exercise Goal Re-Evaluation   Exercise Goals Review Increase Strength and Stamina;Able to understand and use rate of perceived exertion (RPE) scale;Able to understand and use Dyspnea scale;Understanding of Exercise Prescription;Knowledge and understanding of Target Heart Rate Range (THRR) Increase Physical Activity;Increase Strength and Stamina;Understanding of Exercise Prescription      Comments Reviewed RPE and dyspnea scale, THR and program prescription with pt today.  Pt voiced understanding and was  given a copy of goals to take home. Ailanie is off to a great start in the program. She was able to attend her first few sessions during this weeks review period. During her sessions she was able to increase from 2.7 to 2. with no incline. We will continue to monitor her progress in the program.      Expected Outcomes Short: Use RPE daily to regulate intensity. Long: Follow program prescription in THR. Short: Continue to follow exercise prescription. Long: Continue exercise to improve strength and stamina.               Discharge Exercise Prescription (Final Exercise Prescription Changes):  Exercise Prescription Changes - 04/27/24 1400       Response to Exercise   Blood Pressure (Admit) 122/70    Blood Pressure (Exercise) 162/78    Blood Pressure (Exit) 102/60    Heart Rate (Admit) 88 bpm    Heart Rate (Exercise) 125 bpm    Heart Rate (Exit) 85 bpm    Rating of Perceived Exertion (Exercise) 14    Perceived Dyspnea (Exercise) 0    Symptoms none    Comments first 2 weeks of exercise    Duration Progress to 30 minutes of  aerobic without signs/symptoms of physical distress    Intensity THRR unchanged      Progression   Progression Continue to progress workloads to maintain intensity without signs/symptoms of physical distress.    Average METs 2.75      Resistance Training   Training Prescription Yes    Weight 4    Reps 10-15      Interval Training   Interval Training No      Treadmill   MPH 2.8    Grade 0  Minutes 15    METs 3.14      NuStep   Level 3    Minutes 15    METs 3      T5 Nustep   Level 3    Minutes 15    METs 2.4      Oxygen   Maintain Oxygen Saturation 88% or higher             Nutrition:  Target Goals: Understanding of nutrition guidelines, daily intake of sodium 1500mg , cholesterol 200mg , calories 30% from fat and 7% or less from saturated fats, daily to have 5 or more servings of fruits and vegetables.  Education: All About  Nutrition: -Group instruction provided by verbal, written material, interactive activities, discussions, models, and posters to present general guidelines for heart healthy nutrition including fat, fiber, MyPlate, the role of sodium in heart healthy nutrition, utilization of the nutrition label, and utilization of this knowledge for meal planning. Follow up email sent as well. Written material given at graduation.   Biometrics:  Pre Biometrics - 04/08/24 1338       Pre Biometrics   Height 5' 2.64" (1.591 m)    Weight 150 lb 6.4 oz (68.2 kg)    Waist Circumference 34 inches    Hip Circumference 39.3 inches    Waist to Hip Ratio 0.87 %    BMI (Calculated) 26.95    Single Leg Stand 5.5 seconds              Nutrition Therapy Plan and Nutrition Goals:  Nutrition Therapy & Goals - 04/26/24 1114       Nutrition Therapy   Diet Cardiac, Low Na    Protein (specify units) 75    Fiber 25 grams    Whole Grain Foods 3 servings    Saturated Fats 15 max. grams    Fruits and Vegetables 5 servings/day    Sodium 2 grams      Personal Nutrition Goals   Nutrition Goal Eat 15-30gProtein and 30-60gCarbs at each meal.    Personal Goal #2 Read labels and reduce sodium intake to below 2300mg . Ideally 1500mg  per day.    Personal Goal #3 Reduce saturated fat, less than 12g per day. Replace bad fats for more heart healthy fats.    Comments Rian reports she was eating eggs and bacon, ice cream and other food that were not heart healthy but has been working on making changes and feels she is doing much better. Spoke with her today about what makes foods heart healthy. She is quite knowledgeable of heart healthy eating. Provided her with a guideline limit of less than 12g saturated fat per day and less than 1500mg  Sodium per day. Reviewed her recall, she is eating 3 times per day sometimes has a snack. She has been doing better with eating more lean meats and veggies. She has several veggies growing in her  garden. She uses whole grain bread. Reviewed mediterranean diet handout. Educated on types of fats, sources, and how to read labels. Reviewed several labels together and brainstormed meals and snacks with foods she likes and will eat.      Intervention Plan   Intervention Prescribe, educate and counsel regarding individualized specific dietary modifications aiming towards targeted core components such as weight, hypertension, lipid management, diabetes, heart failure and other comorbidities.;Nutrition handout(s) given to patient.    Expected Outcomes Short Term Goal: Understand basic principles of dietary content, such as calories, fat, sodium, cholesterol and nutrients.;Short Term Goal: A  plan has been developed with personal nutrition goals set during dietitian appointment.;Long Term Goal: Adherence to prescribed nutrition plan.             Nutrition Assessments:  MEDIFICTS Score Key: >=70 Need to make dietary changes  40-70 Heart Healthy Diet <= 40 Therapeutic Level Cholesterol Diet  Flowsheet Row Cardiac Rehab from 04/14/2024 in [facility] Cardiac and Pulmonary Rehab  Picture Your Plate Total Score on Admission 79      Picture Your Plate Scores: <40 Unhealthy dietary pattern with much room for improvement. 41-50 Dietary pattern unlikely to meet recommendations for good health and room for improvement. 51-60 More healthful dietary pattern, with some room for improvement.  >60 Healthy dietary pattern, although there may be some specific behaviors that could be improved.    Nutrition Goals Re-Evaluation:   Nutrition Goals Discharge (Final Nutrition Goals Re-Evaluation):   Psychosocial: Target Goals: Acknowledge presence or absence of significant depression and/or stress, maximize coping skills, provide positive support system. Participant is able to verbalize types and ability to use techniques and skills needed for reducing stress and depression.   Education: Stress, Anxiety, and  Depression - Group verbal and visual presentation to define topics covered.  Reviews how body is impacted by stress, anxiety, and depression.  Also discusses healthy ways to reduce stress and to treat/manage anxiety and depression.  Written material given at graduation. Flowsheet Row Cardiac Rehab from 04/28/2024 in [facility] Cardiac and Pulmonary Rehab  Date 04/28/24  Educator [facility]  Instruction Review Code 1- [employer] Understanding       Education: Sleep Hygiene -Provides group verbal and written instruction about how sleep can affect your health.  Define sleep hygiene, discuss sleep cycles and impact of sleep habits. Review good sleep hygiene tips.    Initial Review & Psychosocial Screening:  Initial Psych Review & Screening - 04/06/24 1357       Initial Review   Current issues with None Identified      Family Dynamics   Good Support System? Yes   2 daughters, friends     Barriers   Psychosocial barriers to participate in program There are no identifiable barriers or psychosocial needs.      Screening Interventions   Interventions Encouraged to exercise;To provide support and resources with identified psychosocial needs;Provide feedback about the scores to participant    Expected Outcomes Short Term goal: Utilizing psychosocial counselor, staff and physician to assist with identification of specific Stressors or current issues interfering with healing process. Setting desired goal for each stressor or current issue identified.;Long Term Goal: Stressors or current issues are controlled or eliminated.;Short Term goal: Identification and review with participant of any Quality of Life or Depression concerns found by scoring the questionnaire.;Long Term goal: The participant improves quality of Life and PHQ9 Scores as seen by post scores and/or verbalization of changes             Quality of Life Scores:   Quality of Life - 04/14/24 1537       Quality of Life   Select Quality of Life       Quality of Life Scores   Health/Function Pre 26.3 %    Socioeconomic Pre 30 %    Psych/Spiritual Pre 30 %    Family Pre 28.5 %    GLOBAL Pre 28.24 %            Scores of 19 and below usually indicate a poorer quality of life in these areas.  A difference  of  2-3 points is a clinically meaningful difference.  A difference of 2-3 points in the total score of the Quality of Life Index has been associated with significant improvement in overall quality of life, self-image, physical symptoms, and general health in studies assessing change in quality of life.  PHQ-9: Review Flowsheet       04/08/2024  Depression screen PHQ 2/9  Decreased Interest 0  Down, Depressed, Hopeless 0  PHQ - 2 Score 0  Altered sleeping 0  Tired, decreased energy 1  Change in appetite 0  Feeling bad or failure about yourself  0  Trouble concentrating 0  Moving slowly or fidgety/restless 0  Suicidal thoughts 0  PHQ-9 Score 1  Difficult doing work/chores Not difficult at all   Interpretation of Total Score  Total Score Depression Severity:  1-4 = Minimal depression, 5-9 = Mild depression, 10-14 = Moderate depression, 15-19 = Moderately severe depression, 20-27 = Severe depression   Psychosocial Evaluation and Intervention:  Psychosocial Evaluation - 04/06/24 1413       Psychosocial Evaluation & Interventions   Interventions Encouraged to exercise with the program and follow exercise prescription    Comments There are no barriers to attending the program.  Her 2 daughters are her support. She is ready to get strted with the program.    Expected Outcomes STG attend all scheduled sessions, work on exercise progression while in the program  LTG continue with exercise progression and utilize resources found and taught at program    Continue Psychosocial Services  Follow up required by staff             Psychosocial Re-Evaluation:   Psychosocial Discharge (Final Psychosocial  Re-Evaluation):   Vocational Rehabilitation: Provide vocational rehab assistance to qualifying candidates.   Vocational Rehab Evaluation & Intervention:   Education: Education Goals: Education classes will be provided on a variety of topics geared toward better understanding of heart health and risk factor modification. Participant will state understanding/return demonstration of topics presented as noted by education test scores.  Learning Barriers/Preferences:   General Cardiac Education Topics:  AED/CPR: - Group verbal and written instruction with the use of models to demonstrate the basic use of the AED with the basic ABC's of resuscitation.   Anatomy and Cardiac Procedures: - Group verbal and visual presentation and models provide information about basic cardiac anatomy and function. Reviews the testing methods done to diagnose heart disease and the outcomes of the test results. Describes the treatment choices: Medical Management, Angioplasty, or Coronary Bypass Surgery for treating various heart conditions including Myocardial Infarction, Angina, Valve Disease, and Cardiac Arrhythmias.  Written material given at graduation.   Medication Safety: - Group verbal and visual instruction to review commonly prescribed medications for heart and lung disease. Reviews the medication, class of the drug, and side effects. Includes the steps to properly store meds and maintain the prescription regimen.  Written material given at graduation.   Intimacy: - Group verbal instruction through game format to discuss how heart and lung disease can affect sexual intimacy. Written material given at graduation..   Know Your Numbers and Heart Failure: - Group verbal and visual instruction to discuss disease risk factors for cardiac and pulmonary disease and treatment options.  Reviews associated critical values for Overweight/Obesity, Hypertension, Cholesterol, and Diabetes.  Discusses basics of heart  failure: signs/symptoms and treatments.  Introduces Heart Failure Zone chart for action plan for heart failure.  Written material given at graduation. Flowsheet Row Cardiac Rehab from  04/28/2024 in [facility] Cardiac and Pulmonary Rehab  Date 04/14/24  Educator [facility]  Instruction Review Code 1- Verbalizes Understanding       Infection Prevention: - Provides verbal and written material to individual with discussion of infection control including proper hand washing and proper equipment cleaning during exercise session. Flowsheet Row Cardiac Rehab from 04/28/2024 in [facility] Cardiac and Pulmonary Rehab  Date 04/08/24  Educator MB  Instruction Review Code 1- Verbalizes Understanding       Falls Prevention: - Provides verbal and written material to individual with discussion of falls prevention and safety. Flowsheet Row Cardiac Rehab from 04/28/2024 in [facility] Cardiac and Pulmonary Rehab  Date 04/08/24  Educator MB  Instruction Review Code 1- Verbalizes Understanding       Other: -Provides group and verbal instruction on various topics (see comments)   Knowledge Questionnaire Score:  Knowledge Questionnaire Score - 04/14/24 1540       Knowledge Questionnaire Score   Pre Score 26             Core Components/Risk Factors/Patient Goals at Admission:  Personal Goals and Risk Factors at Admission - 04/08/24 1339       Core Components/Risk Factors/Patient Goals on Admission    Weight Management Yes;Weight Loss;Weight Maintenance    Intervention Weight Management: Develop a combined nutrition and exercise program designed to reach desired caloric intake, while maintaining appropriate intake of nutrient and fiber, sodium and fats, and appropriate energy expenditure required for the weight goal.;Weight Management: Provide education and appropriate resources to help participant work on and attain dietary goals.;Weight Management/Obesity: Establish reasonable short term and long term weight goals.     Admit Weight 150 lb 6.4 oz (68.2 kg)    Goal Weight: Short Term 142 lb 14.4 oz (64.8 kg)    Goal Weight: Long Term 135 lb (61.2 kg)    Expected Outcomes Short Term: Continue to assess and modify interventions until short term weight is achieved;Long Term: Adherence to nutrition and physical activity/exercise program aimed toward attainment of established weight goal;Weight Maintenance: Understanding of the daily nutrition guidelines, which includes 25-35% calories from fat, 7% or less cal from saturated fats, less than 200mg  cholesterol, less than 1.5gm of sodium, & 5 or more servings of fruits and vegetables daily;Weight Loss: Understanding of general recommendations for a balanced deficit meal plan, which promotes 1-2 lb weight loss per week and includes a negative energy balance of [id number] kcal/d;Understanding recommendations for meals to include 15-35% energy as protein, 25-35% energy from fat, 35-60% energy from carbohydrates, less than 200mg  of dietary cholesterol, 20-35 gm of total fiber daily;Understanding of distribution of calorie intake throughout the day with the consumption of 4-5 meals/snacks    Hypertension Yes    Intervention Provide education on lifestyle modifcations including regular physical activity/exercise, weight management, moderate sodium restriction and increased consumption of fresh fruit, vegetables, and low fat dairy, alcohol moderation, and smoking cessation.;Monitor prescription use compliance.    Expected Outcomes Short Term: Continued assessment and intervention until BP is < 140/29mm HG in hypertensive participants. < 130/80mm HG in hypertensive participants with diabetes, heart failure or chronic kidney disease.;Long Term: Maintenance of blood pressure at goal levels.    Lipids Yes    Intervention Provide education and support for participant on nutrition & aerobic/resistive exercise along with prescribed medications to achieve LDL 70mg , HDL >40mg .    Expected Outcomes  Short Term: Participant states understanding of desired cholesterol values and is compliant with medications prescribed. Participant is following exercise  prescription and nutrition guidelines.;Long Term: Cholesterol controlled with medications as prescribed, with individualized exercise RX and with personalized nutrition plan. Value goals: LDL < 70mg , HDL > 40 mg.             Education:Diabetes - Individual verbal and written instruction to review signs/symptoms of diabetes, desired ranges of glucose level fasting, after meals and with exercise. Acknowledge that pre and post exercise glucose checks will be done for 3 sessions at entry of program.   Core Components/Risk Factors/Patient Goals Review:    Core Components/Risk Factors/Patient Goals at Discharge (Final Review):    ITP Comments:  ITP Comments     Row Name 04/06/24 1412 04/08/24 1126 04/14/24 1136 04/28/24 1328     ITP Comments Virtual orientation call completed today. shehas an appointment on Date: 04/08/2024  for EP eval and gym Orientation.  Documentation of diagnosis can be found in [facility] 12/31/2023 . Completed and gym orientation for cardiac rehab. Initial ITP created and sent for review to Dr. Firman Hughes, Medical Director. First full day of exercise!  Patient was oriented to gym and equipment including functions, settings, policies, and procedures.  Patient's individual exercise prescription and treatment plan were reviewed.  All starting workloads were established based on the results of the 6 minute walk test done at initial orientation visit.  The plan for exercise progression was also introduced and progression will be customized based on patient's performance and goals. 30 Day review completed. Medical Director ITP review done, changes made as directed, and signed approval by Medical Director.    new to program             Comments:

## 2024-04-29 ENCOUNTER — Ambulatory Visit: Attending: Internal Medicine | Admitting: Pharmacist

## 2024-04-29 ENCOUNTER — Other Ambulatory Visit (HOSPITAL_COMMUNITY): Payer: Self-pay

## 2024-04-29 VITALS — BP 120/80 | HR 73

## 2024-04-29 DIAGNOSIS — I10 Essential (primary) hypertension: Secondary | ICD-10-CM

## 2024-04-29 MED ORDER — METOPROLOL SUCCINATE ER 25 MG PO TB24: 12.5000 mg | ORAL_TABLET | Freq: Every day | ORAL | 3 refills | Status: DC

## 2024-04-29 NOTE — Progress Notes (Signed)
 Patient ID: Sheila Marquez                 DOB: [birth year]                    MRN: [id number]      HPI: Sheila Marquez is a 73 y.o. female patient of Dr. Gollan, referred to PharmD by Dr. Bruce Caper [facility]). PMH is significant for CAD with recent NSTEMI status post PCI/DES to the mid LAD complicated by wire perforation of a likely tiny diagonal branch on 12/31/2023, HFrEF secondary to ICM, HTN, and HLD.  I saw the patient back in April for initiation of PCSK9 inhibitor.  At that visit she was concerned about her elevated blood pressure since being discharged from the hospital.  Noted that while she was in the hospital her blood pressure was low and her losartan  was decreased to 12.5 mg daily.  But once she she got home her blood pressure started trending back up.  Losartan  was increased to 25 mg daily.  Patient presents today for follow-up accompanied by her daughter.  Reports that her blood pressure is doing much better.  Brings in a good list of blood pressure readings which included some of the below : 133/79, 129/70, 108/58, 120/64, 109/62, 124/64, 123/62  She reports that the Repatha  injection hurts and stings but she continues to do it every 14 days.  Has an appointment with her primary care coming up in July or August and will get repeat lipids then.  She brought in her home blood pressure cuff which is in old [employer].  It was found to be accurate  137/74 home 140/78 manual 120/80 manual 125/73 home  Current HTN Meds: losartan  25mg  daily, metoprolol  12.5mg  daily  Current Medications: ezetimibe  10 mg  Intolerances: statins (myalgias) Risk Factors: NSTEMI, HFrEF, HTN, age > 65 LDL-C goal: < 55 mg/dl  ApoB goal: < 70 mg/dl  Diet: vegetables, bread, pasta, chicken, salmon Drinks: water (flavored with lemon or zero-sugar drops), caffeinated tea  Exercise: Prior to MI, doing water aerobics and going to [employer]. Interested in cardiac rehab.   Family History: CAD  (brother), breast cancer (cousin)  Social History: Never smoker. No smokeless tobacco. She reports current alcohol use and does not use drugs.   Labs: Lipid Panel     Component Value Date/Time   CHOL 163 01/02/2024 0222   TRIG 51 01/02/2024 0222   HDL 80 01/02/2024 0222   CHOLHDL 2.0 01/02/2024 0222   VLDL 10 01/02/2024 0222   LDLCALC 73 01/02/2024 0222    Past Medical History:  Diagnosis Date   Eczema    Heart murmur    Hyperlipidemia    Hypertension    Plantar fasciitis     Current Outpatient Medications on File Prior to Visit  Medication Sig Dispense Refill   acetaminophen  (TYLENOL ) 500 MG tablet Take 500 mg by mouth every 6 (six) hours as needed for mild pain (pain score 1-3) or fever.     aspirin  81 MG chewable tablet Chew 1 tablet (81 mg total) by mouth daily. 30 tablet 12   calcium carbonate (OSCAL) 1500 (600 Ca) MG TABS tablet Take 600 mg of elemental calcium by mouth 2 (two) times daily with a meal.     cetirizine (ZYRTEC) 10 MG tablet Take 10 mg by mouth daily.     diphenhydrAMINE (BENADRYL) 25 MG tablet Take 25 mg by mouth every 6 (six) hours as needed.  docusate sodium (COLACE) 50 MG capsule Take 50 mg by mouth 2 (two) times daily as needed for mild constipation.     Evolocumab  (REPATHA  SURECLICK) 140 MG/ML SOAJ Inject 140 mg into the skin every 14 (fourteen) days. 6 mL 3   ezetimibe  (ZETIA ) 10 MG tablet Take 1 tablet by mouth daily.     glucosamine-chondroitin 500-400 MG tablet Take 1 tablet by mouth 2 (two) times daily.     losartan  (COZAAR ) 25 MG tablet Take 1 tablet (25 mg total) by mouth daily. 100 tablet 3   magnesium oxide (MAG-OX) 400 MG tablet Take 400 mg by mouth daily.     Multiple Vitamin (MULTI-VITAMIN) tablet Take 1 tablet by mouth daily.     psyllium (METAMUCIL) 58.6 % packet Take 1 packet by mouth 2 (two) times daily.     spironolactone  (ALDACTONE ) 25 MG tablet Take 0.5 tablets (12.5 mg total) by mouth daily. 15 tablet 5   ticagrelor  (BRILINTA )  90 MG TABS tablet Take 1 tablet (90 mg total) by mouth 2 (two) times daily. 60 tablet 5   zinc gluconate 50 MG tablet Take 50 mg by mouth daily.     No current facility-administered medications on file prior to visit.    Allergies  Allergen Reactions   Statins Other (See Comments)    Myalgia   Hydrocodone Nausea And Vomiting   Propoxyphene Nausea And Vomiting    Assessment/Plan:  1. Hyperlipidemia -  HTN (hypertension) Assessment: Blood pressure much better controlled in clinic today Home readings also at goal Home blood pressure cuff found to be accurate Refill for metoprolol  sent in  Plan: Continue losartan  25 mg daily and metoprolol  12.5 mg daily Follow-up as needed  Thank you,    Mozell Hardacre D Adrianne Shackleton, Pharm.Monika Annas, CPP [facility] [address] [place], [place] [address]  Phone: [phone]; Fax: [phone]

## 2024-04-29 NOTE — Patient Instructions (Signed)
 Please continue losartan  25mg  daily and metoprolol  12.5mg  daily

## 2024-04-29 NOTE — Assessment & Plan Note (Signed)
 Assessment: Blood pressure much better controlled in clinic today Home readings also at goal Home blood pressure cuff found to be accurate Refill for metoprolol  sent in  Plan: Continue losartan  25 mg daily and metoprolol  12.5 mg daily Follow-up as needed

## 2024-04-30 ENCOUNTER — Encounter: Admitting: *Deleted

## 2024-04-30 ENCOUNTER — Other Ambulatory Visit: Payer: Self-pay

## 2024-04-30 DIAGNOSIS — I214 Non-ST elevation (NSTEMI) myocardial infarction: Secondary | ICD-10-CM

## 2024-04-30 DIAGNOSIS — Z955 Presence of coronary angioplasty implant and graft: Secondary | ICD-10-CM

## 2024-04-30 MED ORDER — METOPROLOL SUCCINATE ER 25 MG PO TB24: 12.5000 mg | ORAL_TABLET | Freq: Every day | ORAL | 3 refills | Status: DC

## 2024-04-30 NOTE — Progress Notes (Signed)
 Daily Session Note  Patient Details  Name: Sheila Marquez MRN: [id number] Date of Birth: 09-14-1951 Referring Provider:   Flowsheet Row Cardiac Rehab from 04/08/2024 in [facility] Cardiac and Pulmonary Rehab  Referring Provider Belva Boyden, MD       Encounter Date: 04/30/2024  Check In:  Session Check In - 04/30/24 1144       Check-In   Supervising physician immediately available to respond to emergencies See telemetry face sheet for immediately available ER MD    Location [facility]-Cardiac & Pulmonary Rehab    Staff Present Maud Sorenson, RN, BSN, CCRP;Joseph Hood RCP,RRT,BSRT;Maxon Mitchell BS, Exercise Physiologist;Jason Martina Sledge RDN,LDN    Virtual Visit No    Medication changes reported     No    Fall or balance concerns reported    No    Warm-up and Cool-down Performed on first and last piece of equipment    Resistance Training Performed Yes    VAD Patient? No    PAD/SET Patient? No      Pain Assessment   Currently in Pain? No/denies                Social History   Tobacco Use  Smoking Status Never  Smokeless Tobacco Never    Goals Met:  Independence with exercise equipment Exercise tolerated well No report of concerns or symptoms today  Goals Unmet:  Not Applicable  Comments: Pt able to follow exercise prescription today without complaint.  Will continue to monitor for progression.    Dr. Firman Hughes is Medical Director for [facility] Cardiac Rehabilitation.  Dr. Fuad Aleskerov is Medical Director for [facility] Pulmonary Rehabilitation.

## 2024-05-03 ENCOUNTER — Encounter: Admitting: *Deleted

## 2024-05-03 DIAGNOSIS — I214 Non-ST elevation (NSTEMI) myocardial infarction: Secondary | ICD-10-CM

## 2024-05-03 DIAGNOSIS — Z955 Presence of coronary angioplasty implant and graft: Secondary | ICD-10-CM

## 2024-05-03 NOTE — Progress Notes (Signed)
 Daily Session Note  Patient Details  Name: Sheila Marquez MRN: [id number] Date of Birth: [birth year] Referring Provider:   Flowsheet Row Cardiac Rehab from 04/08/2024 in [facility] Cardiac and Pulmonary Rehab  Referring Provider Belva Boyden, MD       Encounter Date: 05/03/2024  Check In:  Session Check In - 05/03/24 1134       Check-In   Supervising physician immediately available to respond to emergencies See telemetry face sheet for immediately available ER MD    Location [facility]-Cardiac & Pulmonary Rehab    Staff Present Maud Sorenson, RN, BSN, CCRP;Joseph Hood RCP,RRT,BSRT;Maxon [employer], Exercise Physiologist;Kelly [employer], ACSM CEP, Exercise Physiologist;Margaret Best, MS, Exercise Physiologist    Virtual Visit No    Medication changes reported     No    Fall or balance concerns reported    No    Warm-up and Cool-down Performed on first and last piece of equipment    Resistance Training Performed Yes    VAD Patient? No    PAD/SET Patient? No      Pain Assessment   Currently in Pain? No/denies                Social History   Tobacco Use  Smoking Status Never  Smokeless Tobacco Never    Goals Met:  Independence with exercise equipment Exercise tolerated well No report of concerns or symptoms today  Goals Unmet:  Not Applicable  Comments: Pt able to follow exercise prescription today without complaint.  Will continue to monitor for progression.    Dr. Firman Hughes is Medical Director for [facility] Cardiac Rehabilitation.  Dr. Fuad Aleskerov is Medical Director for [facility] Pulmonary Rehabilitation.

## 2024-05-05 ENCOUNTER — Encounter: Admitting: *Deleted

## 2024-05-05 DIAGNOSIS — Z955 Presence of coronary angioplasty implant and graft: Secondary | ICD-10-CM

## 2024-05-05 DIAGNOSIS — I214 Non-ST elevation (NSTEMI) myocardial infarction: Secondary | ICD-10-CM

## 2024-05-05 NOTE — Progress Notes (Signed)
 Daily Session Note  Patient Details  Name: Sheila Marquez MRN: [id number] Date of Birth: 01/13/1951 Referring Provider:   Flowsheet Row Cardiac Rehab from 04/08/2024 in [facility] Cardiac and Pulmonary Rehab  Referring Provider Belva Boyden, MD       Encounter Date: 05/05/2024 2 Check In:  Session Check In - 05/05/24 1158       Check-In   Supervising physician immediately available to respond to emergencies See telemetry face sheet for immediately available ER MD    Location [facility]-Cardiac & Pulmonary Rehab    Staff Present Maud Sorenson, RN, BSN, CCRP;Joseph Hood RCP,RRT,BSRT;Maxon [place] BS, Exercise Physiologist;Noah Tickle, BS, Exercise Physiologist    Virtual Visit No    Medication changes reported     No    Fall or balance concerns reported    No    Warm-up and Cool-down Performed on first and last piece of equipment    Resistance Training Performed Yes    VAD Patient? No    PAD/SET Patient? No      Pain Assessment   Currently in Pain? No/denies                Social History   Tobacco Use  Smoking Status Never  Smokeless Tobacco Never    Goals Met:  Independence with exercise equipment Exercise tolerated well No report of concerns or symptoms today  Goals Unmet:  Not Applicable  Comments: Pt able to follow exercise prescription today without complaint.  Will continue to monitor for progression.    Dr. Firman Hughes is Medical Director for [facility] Cardiac Rehabilitation.  Dr. Fuad Aleskerov is Medical Director for [facility] Pulmonary Rehabilitation.

## 2024-05-07 ENCOUNTER — Encounter: Admitting: *Deleted

## 2024-05-07 DIAGNOSIS — Z955 Presence of coronary angioplasty implant and graft: Secondary | ICD-10-CM

## 2024-05-07 DIAGNOSIS — I214 Non-ST elevation (NSTEMI) myocardial infarction: Secondary | ICD-10-CM

## 2024-05-07 NOTE — Progress Notes (Signed)
 Daily Session Note  Patient Details  Name: Sheila Marquez MRN: [id number] Date of Birth: 1951-09-20 Referring Provider:   Flowsheet Row Cardiac Rehab from 04/08/2024 in [facility] Cardiac and Pulmonary Rehab  Referring Provider Belva Boyden, MD       Encounter Date: 05/07/2024  Check In:  Session Check In - 05/07/24 1114       Check-In   Supervising physician immediately available to respond to emergencies See telemetry face sheet for immediately available ER MD    Location [facility]-Cardiac & Pulmonary Rehab    Staff Present Maud Sorenson, RN, BSN, CCRP;Joseph Hood RCP,RRT,BSRT;Maxon [place] BS, Exercise Physiologist;Noah Tickle, BS, Exercise Physiologist    Virtual Visit No    Medication changes reported     No    Fall or balance concerns reported    No    Warm-up and Cool-down Performed on first and last piece of equipment    Resistance Training Performed Yes    VAD Patient? No    PAD/SET Patient? No      Pain Assessment   Currently in Pain? No/denies                Social History   Tobacco Use  Smoking Status Never  Smokeless Tobacco Never    Goals Met:  Independence with exercise equipment Exercise tolerated well No report of concerns or symptoms today  Goals Unmet:  Not Applicable  Comments: Pt able to follow exercise prescription today without complaint.  Will continue to monitor for progression.    Dr. Firman Hughes is Medical Director for [facility] Cardiac Rehabilitation.  Dr. Fuad Aleskerov is Medical Director for [facility] Pulmonary Rehabilitation.

## 2024-05-10 ENCOUNTER — Encounter: Admitting: *Deleted

## 2024-05-10 DIAGNOSIS — I214 Non-ST elevation (NSTEMI) myocardial infarction: Secondary | ICD-10-CM

## 2024-05-10 DIAGNOSIS — Z955 Presence of coronary angioplasty implant and graft: Secondary | ICD-10-CM

## 2024-05-10 NOTE — Progress Notes (Signed)
 Daily Session Note  Patient Details  Name: Sheila Marquez MRN: [id number] Date of Birth: Sep 27, 1951 Referring Provider:   Flowsheet Row Cardiac Rehab from 04/08/2024 in [facility] Cardiac and Pulmonary Rehab  Referring Provider Belva Boyden, MD       Encounter Date: 05/10/2024  Check In:  Session Check In - 05/10/24 1121       Check-In   Supervising physician immediately available to respond to emergencies See telemetry face sheet for immediately available ER MD    Location [facility]-Cardiac & Pulmonary Rehab    Staff Present Maud Sorenson, RN, BSN, CCRP;Kelly [place] BS, ACSM CEP, Exercise Physiologist;Maxon [place] BS, Exercise Physiologist    Virtual Visit No    Medication changes reported     No    Fall or balance concerns reported    No    Warm-up and Cool-down Performed on first and last piece of equipment    Resistance Training Performed Yes    VAD Patient? No    PAD/SET Patient? No      Pain Assessment   Currently in Pain? No/denies                Social History   Tobacco Use  Smoking Status Never  Smokeless Tobacco Never    Goals Met:  Independence with exercise equipment Exercise tolerated well No report of concerns or symptoms today  Goals Unmet:  Not Applicable  Comments: Pt able to follow exercise prescription today without complaint.  Will continue to monitor for progression.    Dr. Firman Hughes is Medical Director for [facility] Cardiac Rehabilitation.  Dr. Fuad Aleskerov is Medical Director for [facility] Pulmonary Rehabilitation.

## 2024-05-12 ENCOUNTER — Encounter: Admitting: *Deleted

## 2024-05-12 DIAGNOSIS — I214 Non-ST elevation (NSTEMI) myocardial infarction: Secondary | ICD-10-CM

## 2024-05-12 DIAGNOSIS — Z955 Presence of coronary angioplasty implant and graft: Secondary | ICD-10-CM

## 2024-05-12 NOTE — Progress Notes (Signed)
 Daily Session Note  Patient Details  Name: Sheila Marquez MRN: [id number] Date of Birth: Sep 16, 1951 Referring Provider:   Flowsheet Row Cardiac Rehab from 04/08/2024 in [facility] Cardiac and Pulmonary Rehab  Referring Provider Belva Boyden, MD       Encounter Date: 05/12/2024  Check In:  Session Check In - 05/12/24 1700       Check-In   Supervising physician immediately available to respond to emergencies See telemetry face sheet for immediately available ER MD    Location [facility]-Cardiac & Pulmonary Rehab    Staff Present Maud Sorenson, RN, BSN, CCRP;Maxon [place] BS, Exercise Physiologist;Noah Tickle, BS, Exercise Physiologist;Jason Martina Sledge RDN,LDN    Virtual Visit No    Medication changes reported     No    Fall or balance concerns reported    No    Warm-up and Cool-down Performed on first and last piece of equipment    Resistance Training Performed Yes    VAD Patient? No    PAD/SET Patient? No      Pain Assessment   Currently in Pain? No/denies                Social History   Tobacco Use  Smoking Status Never  Smokeless Tobacco Never    Goals Met:  Independence with exercise equipment Exercise tolerated well No report of concerns or symptoms today  Goals Unmet:  Not Applicable  Comments: Pt able to follow exercise prescription today without complaint.  Will continue to monitor for progression.    Dr. Firman Hughes is Medical Director for [facility] Cardiac Rehabilitation.  Dr. Fuad Aleskerov is Medical Director for [facility] Pulmonary Rehabilitation.

## 2024-05-14 ENCOUNTER — Encounter: Admitting: *Deleted

## 2024-05-14 DIAGNOSIS — I214 Non-ST elevation (NSTEMI) myocardial infarction: Secondary | ICD-10-CM

## 2024-05-14 DIAGNOSIS — Z955 Presence of coronary angioplasty implant and graft: Secondary | ICD-10-CM

## 2024-05-14 NOTE — Progress Notes (Signed)
 Daily Session Note  Patient Details  Name: Sheila Marquez MRN: [id number] Date of Birth: 1951/07/03 Referring Provider:   Flowsheet Row Cardiac Rehab from 04/08/2024 in [facility] Cardiac and Pulmonary Rehab  Referring Provider Belva Boyden, MD       Encounter Date: 05/14/2024  Check In:  Session Check In - 05/14/24 1115       Check-In   Supervising physician immediately available to respond to emergencies See telemetry face sheet for immediately available ER MD    Location [facility]-Cardiac & Pulmonary Rehab    Staff Present Maud Sorenson, RN, BSN, CCRP;Joseph Hood RCP,RRT,BSRT;Maxon [place] BS, Exercise Physiologist;Noah Tickle, BS, Exercise Physiologist    Virtual Visit No    Medication changes reported     No    Fall or balance concerns reported    No    Warm-up and Cool-down Performed on first and last piece of equipment    Resistance Training Performed Yes    VAD Patient? No    PAD/SET Patient? No      Pain Assessment   Currently in Pain? No/denies                Social History   Tobacco Use  Smoking Status Never  Smokeless Tobacco Never    Goals Met:  Independence with exercise equipment Exercise tolerated well No report of concerns or symptoms today  Goals Unmet:  Not Applicable  Comments: Pt able to follow exercise prescription today without complaint.  Will continue to monitor for progression.    Dr. Firman Hughes is Medical Director for [facility] Cardiac Rehabilitation.  Dr. Fuad Aleskerov is Medical Director for [facility] Pulmonary Rehabilitation.

## 2024-05-15 NOTE — Progress Notes (Unsigned)
 Cardiology Clinic Note   Date: 05/18/2024 ID: Sheila Marquez Sheila Marquez, MRN [id number]  Primary Cardiologist:  Belva Boyden, MD  Chief Complaint   Sheila Marquez is a 73 y.o. female who presents to the clinic today for routine follow up.   Patient Profile   Sheila Marquez is followed by Dr. Gollan for the history outlined below.       Past medical history significant for: CAD. LHC 12/31/2023 (NSTEMI): Severe single-vessel CAD with occlusion of mid LAD.  Minimal luminal irregularities are noted in dominant LCx.  Moderate to severely reduced LV systolic function (LVEF 30 to 40%) with mid/apical anterior and apical inferior hypokinesis/akinesis.  Normal LVEDP.  Successful IVUS guided PCI to mid LAD with DES 2.25 x 30 mm.  PCI complicated by wire perforation of small branch (most likely tiny diagonal).  Perforation controlled with balloon tamponade in the proximal LAD.  Small pericardial effusion noted at the time of perforation patient remained hemodynamically stable.  Small and tortuous right radial artery not suitable for catheterization.  Procedure performed via right ulnar artery. Chronic HFmrEF/ischemic cardiomyopathy/pericardial effusion. Echo 01/01/2024: EF 45 to 50%.  Moderate asymmetric left ventricle hypertrophy of the basal-septal segment.  Severe akinesis of the left ventricular, mid-- apical anteroseptal wall, anterior wall and apical segment.  Grade I DD.  Normal RV size/function.  Small pericardial effusion localized near the right ventricle with no evidence of cardiac tamponade.  Trivial MR.  Aortic valve sclerosis without stenosis. Limited echo 01/13/2024: EF 45 to 50%.  Moderate asymmetric left ventricular hypertrophy of the basal-septal segment.  Normal RV size/function.  Trivial pericardial effusion with no evidence of cardiac tamponade.  Mild MR. Hypertension. Hyperlipidemia. LPa 01/01/2024: <8.4. Lipid panel 01/02/2024: LDL 73, HDL 80, TG 51, total  163. Syncope. 14-day ZIO 03/23/2024: HR 41 to 121 bpm, average 66 bpm rare ectopy.  In summary, patient was admitted to [facility] on 12/31/2023 with an NSTEMI after developing symptoms of angina the night prior.  EKG showed sinus tachycardia with 1 mm of ST elevation isolated in V2 with septal T wave inversions and anterior septal Q waves.  Troponin peaked at 9158.  LHC showed severe single-vessel CAD with occlusion of the mid LAD as detailed above.  She underwent PCI with DES to mid LAD complicated by wire perforation of small branch (likely tiny diagonal) controlled with balloon tamponade in the proximal LAD.  A small pericardial effusion was noted at the time of perforation.  Serial echocardiograms performed at bedside demonstrated stable small pericardial effusion.  She remained hemodynamically stable throughout the procedure.  She was transferred to [facility] for further observation and treatment.  Repeat echo the following day showed EF 45 to 50% as detailed above.  Post cath course further complicated by right arm hematoma with good perfusion.  Patient was seen in the office by Varney Gentleman, PA-C on 01/13/2024 for hospital follow-up.  Patient reported she was diagnosed with shingles on 01/02/2024 and was not prescribed antiviral therapy.  She experienced fatigue and diminished appetite and was diagnosed with flu on 01/11/2024.  She denied symptoms of angina.  Right radial hematoma was improving.  At the end of patient's follow-up visit she began to feel flushed and had a syncopal episode lasting 1 to 2 minutes.  She had been anxious about having labs drawn and kept saying she felt hot.  Initial BP 107/71, HR 52 bpm, SpO2 97%, blood glucose 137.  Repeat EKG demonstrated sinus bradycardia 52 bpm, inferior  ST elevation with stable anterior T wave inversion with new T wave inversion along V6.  Hypotension and bradycardia resolved spontaneously while in the office.  She was given 324 mg aspirin .  Repeat EKG showed NSR  71 bpm and normalization of inferior ST segments with persistent anterior T wave inversion.  She was transported by EMS to the ED where she remained hemodynamically stable.  High-sensitivity troponin 46 with delta troponin of 54 subsequently downtrending to 46.  Renal function mildly elevated at 1.08 with baseline around 0.7.  CBC unremarkable.  Chest x-ray without evidence of acute cardiopulmonary process.  While in the emergency room she was evaluated by interventional cardiology and reported feeling back to baseline.  She reported similar episodes of feeling hot and getting lightheaded in the remote past as well as anxiety about blood draws.  Transient inferior ST elevation felt to be secondary to supply-demand mismatch and global ischemia from bradycardia/hypotension.  Minimal troponin elevation relatively flat and may reflect supply-demand mismatch from today's event or residual troponin from recent MI.  Patient was discharged from the ED the same day.   Patient was last seen in the office by me on 02/16/2024 for hospital follow-up.  She denied any further syncopal episodes and was fully recovered from shingles and flu.  She had not received ZIO monitor in the mail. She questioned if she needed to complete the monitor and importance of full workup for syncope discussed. She agreed to proceed with the monitor.      History of Present Illness    Today, patient reports she is doing well. Patient denies shortness of breath, dyspnea on exertion, lower extremity edema, orthopnea or PND. No chest pain, pressure, or tightness. No palpitations.  She has had no further syncopal episodes. She is attending cardiac rehab 3 days a week with good tolerance. She plans to return to [employer] when she graduates from rehab. She reports her BP had been trending up and Losartan  was increased during her pharm D lipid clinic visit.     ROS: All other systems reviewed and are otherwise negative except as  noted in History of Present Illness.  EKGs/Labs Reviewed       EKG is no performed today.   01/13/2024: ALT 36; AST 43; BUN 18; Creatinine, Ser 1.08; Potassium 3.5; Sodium 137   01/13/2024: Hemoglobin 13.8; WBC 8.1   12/31/2023: TSH 0.963    Physical Exam    VS:  BP 130/62   Pulse 77   Ht 5\' 2"  (1.575 m)   Wt 149 lb (67.6 kg)   LMP  (LMP Unknown)   SpO2 98%   BMI 27.25 kg/m  , BMI Body mass index is 27.25 kg/m.  GEN: Well nourished, well developed, in no acute distress. Neck: No JVD or carotid bruits. Cardiac:  RRR. No murmurs. No rubs or gallops.   Respiratory:  Respirations regular and unlabored. Clear to auscultation without rales, wheezing or rhonchi. GI: Soft, nontender, nondistended. Extremities: Radials/DP/PT 2+ and equal bilaterally. No clubbing or cyanosis. No edema.  Skin: Warm and dry, no rash. Neuro: Strength intact.  Assessment & Plan   CAD S/p PCI with DES to proximal LAD in the setting of NSTEMI January 2025 complicated by wire perforation of (likely) tiny diagonal and development of small pericardial effusion.  Patient denies chest pain, pressure or tightness. She is attending cardiac rehab 3 days a week with good tolerance.  -Continue aspirin , Brilinta , Toprol , Zetia .   Chronic HFmrEF/ischemic cardiomyopathy/pericardial  effusion Echo 01/01/2024 showed EF 45 to 50%, wall motion abnormalities as described above in narrative history, Grade I DD, small pericardial effusion localized near the right ventricle without cardiac tamponade.  Repeat limited echo 01/13/2024 demonstrated EF 45 to 50%, moderate asymmetric LVH of basal-septal segment, trivial pericardial effusion with no evidence of cardiac tamponade.  Patient denies shortness of breath, DOE, lower extremity edema, orthopnea or PND.  Euvolemic and well compensated on exam. -Continue losartan , Toprol , spironolactone .   Hypertension BP today 130/62. Losartan  was recently increased. No report of headache or  dizziness.  -Continue losartan , Toprol , spironolactone .   Hyperlipidemia/myalgias from statins LPa January 2025 <8.4.  LDL 73, not quite at goal. -Continue Repatha  and Zetia . - PCP will check lipid panel in July or August.    Syncope 14-day ZIO April 2025 showed HR 41 to 121 bpm, average 66 bpm, rare ectopy.  Patient with episode of syncope at previous office visit on 01/13/2024 in the setting of shingles, flu, anxiety regarding lab draw.  Patient denies further syncopal episodes.  - No further testing indicated at this time.  Disposition: Return in 6 months or sooner as needed.          Signed, Sheila Marquez. Sheila Carl, DNP, NP-C

## 2024-05-18 ENCOUNTER — Ambulatory Visit: Payer: PPO | Attending: Student | Admitting: Student

## 2024-05-18 ENCOUNTER — Encounter: Payer: Self-pay | Admitting: Student

## 2024-05-18 VITALS — BP 130/62 | HR 77 | Ht 62.0 in | Wt 149.0 lb

## 2024-05-18 DIAGNOSIS — I2510 Atherosclerotic heart disease of native coronary artery without angina pectoris: Secondary | ICD-10-CM

## 2024-05-18 DIAGNOSIS — E785 Hyperlipidemia, unspecified: Secondary | ICD-10-CM

## 2024-05-18 DIAGNOSIS — R55 Syncope and collapse: Secondary | ICD-10-CM | POA: Diagnosis not present

## 2024-05-18 DIAGNOSIS — I10 Essential (primary) hypertension: Secondary | ICD-10-CM | POA: Diagnosis not present

## 2024-05-18 DIAGNOSIS — I5022 Chronic systolic (congestive) heart failure: Secondary | ICD-10-CM | POA: Diagnosis not present

## 2024-05-18 NOTE — Patient Instructions (Signed)
 Medication Instructions:  Your Physician recommend you continue on your current medication as directed.    *If you need a refill on your cardiac medications before your next appointment, please call your pharmacy*  Lab Work: None ordered at this time  If you have labs (blood work) drawn today and your tests are completely normal, you will receive your results only by: MyChart Message (if you have MyChart) OR A paper copy in the mail If you have any lab test that is abnormal or we need to change your treatment, we will call you to review the results.  Testing/Procedures: None ordered at this time   Follow-Up: At [facility], you and your health needs are our priority.  As part of our continuing mission to provide you with exceptional heart care, our providers are all part of one team.  This team includes your primary Cardiologist (physician) and Advanced Practice Providers or APPs (Physician Assistants and Nurse Practitioners) who all work together to provide you with the care you need, when you need it.  Your next appointment:   6 month(s)  Provider:   You may see Timothy Gollan, MD or one of the following Advanced Practice Providers on your designated Care Team:   Laneta Pintos, NP Gildardo Labrador, PA-C Varney Gentleman, PA-C Cadence Green, PA-C Ronald Cockayne, NP Morey Ar, NP    We recommend signing up for the patient portal called "MyChart".  Sign up information is provided on this After Visit Summary.  MyChart is used to connect with patients for Virtual Visits (Telemedicine).  Patients are able to view lab/test results, encounter notes, upcoming appointments, etc.  Non-urgent messages can be sent to your provider as well.   To learn more about what you can do with MyChart, go to [email or web address].

## 2024-05-19 ENCOUNTER — Encounter

## 2024-05-21 ENCOUNTER — Encounter: Admitting: *Deleted

## 2024-05-21 DIAGNOSIS — I214 Non-ST elevation (NSTEMI) myocardial infarction: Secondary | ICD-10-CM | POA: Diagnosis not present

## 2024-05-21 DIAGNOSIS — Z955 Presence of coronary angioplasty implant and graft: Secondary | ICD-10-CM

## 2024-05-21 NOTE — Progress Notes (Signed)
 Daily Session Note  Patient Details  Name: Sheila Marquez MRN: [id number] Date of Birth: Jan 21, 1951 Referring Provider:   Flowsheet Row Cardiac Rehab from 04/08/2024 in [facility] Cardiac and Pulmonary Rehab  Referring Provider Belva Boyden, MD       Encounter Date: 05/21/2024  Check In:  Session Check In - 05/21/24 1138       Check-In   Supervising physician immediately available to respond to emergencies See telemetry face sheet for immediately available ER MD    Location [facility]-Cardiac & Pulmonary Rehab    Staff Present Maud Sorenson, RN, BSN, CCRP;Joseph Hood RCP,RRT,BSRT;[place]on [place] BS, Exercise Physiologist;Noah Tickle, BS, Exercise Physiologist    Virtual Visit No    Medication changes reported     No    Fall or balance concerns reported    No    Warm-up and Cool-down Performed on first and last piece of equipment    Resistance Training Performed Yes    VAD Patient? No    PAD/SET Patient? No      Pain Assessment   Currently in Pain? No/denies                Social History   Tobacco Use  Smoking Status Never  Smokeless Tobacco Never    Goals Met:  Independence with exercise equipment Exercise tolerated well No report of concerns or symptoms today  Goals Unmet:  Not Applicable  Comments: Pt able to follow exercise prescription today without complaint.  Will continue to monitor for progression.    Dr. Firman Hughes is Medical Director for [facility] Cardiac Rehabilitation.  Dr. Fuad Aleskerov is Medical Director for [facility] Pulmonary Rehabilitation.

## 2024-05-24 ENCOUNTER — Encounter: Attending: Cardiovascular Disease

## 2024-05-24 DIAGNOSIS — I214 Non-ST elevation (NSTEMI) myocardial infarction: Secondary | ICD-10-CM | POA: Diagnosis present

## 2024-05-24 DIAGNOSIS — Z48812 Encounter for surgical aftercare following surgery on the circulatory system: Secondary | ICD-10-CM | POA: Insufficient documentation

## 2024-05-24 DIAGNOSIS — Z955 Presence of coronary angioplasty implant and graft: Secondary | ICD-10-CM | POA: Insufficient documentation

## 2024-05-24 DIAGNOSIS — I252 Old myocardial infarction: Secondary | ICD-10-CM | POA: Diagnosis not present

## 2024-05-24 NOTE — Progress Notes (Signed)
 Daily Session Note  Patient Details  Name: Sheila Marquez MRN: [id number] Date of Birth: 1951/09/03 Referring Provider:   Flowsheet Row Cardiac Rehab from 04/08/2024 in [facility] Cardiac and Pulmonary Rehab  Referring Provider Belva Boyden, MD       Encounter Date: 05/24/2024  Check In:  Session Check In - 05/24/24 1113       Check-In   Supervising physician immediately available to respond to emergencies See telemetry face sheet for immediately available ER MD    Location [facility]-Cardiac & Pulmonary Rehab    Staff Present Lyell Samuel, MS, Exercise Physiologist;Ericha Whittingham Dawne Euler, ACSM CEP, Exercise Physiologist;Tayli Buch RN,BSN,MPA;Joseph Hood RCP,RRT,BSRT    Virtual Visit No    Medication changes reported     No    Fall or balance concerns reported    No    Warm-up and Cool-down Performed on first and last piece of equipment    Resistance Training Performed Yes    VAD Patient? No    PAD/SET Patient? No      Pain Assessment   Currently in Pain? No/denies                Social History   Tobacco Use  Smoking Status Never  Smokeless Tobacco Never    Goals Met:  Independence with exercise equipment Exercise tolerated well No report of concerns or symptoms today Strength training completed today  Goals Unmet:  Not Applicable  Comments: Pt able to follow exercise prescription today without complaint.  Will continue to monitor for progression.    Dr. Firman Hughes is Medical Director for [facility] Cardiac Rehabilitation.  Dr. Fuad Aleskerov is Medical Director for [facility] Pulmonary Rehabilitation.

## 2024-05-26 ENCOUNTER — Encounter

## 2024-05-26 ENCOUNTER — Encounter: Payer: Self-pay | Admitting: *Deleted

## 2024-05-26 DIAGNOSIS — Z48812 Encounter for surgical aftercare following surgery on the circulatory system: Secondary | ICD-10-CM | POA: Diagnosis not present

## 2024-05-26 DIAGNOSIS — Z955 Presence of coronary angioplasty implant and graft: Secondary | ICD-10-CM

## 2024-05-26 DIAGNOSIS — I214 Non-ST elevation (NSTEMI) myocardial infarction: Secondary | ICD-10-CM

## 2024-05-26 NOTE — Progress Notes (Signed)
 Daily Session Note  Patient Details  Name: Sheila Marquez MRN: [id number] Date of Birth: 16-Jun-1951 Referring Provider:   Flowsheet Row Cardiac Rehab from 04/08/2024 in [facility] Cardiac and Pulmonary Rehab  Referring Provider Belva Boyden, MD       Encounter Date: 05/26/2024  Check In:  Session Check In - 05/26/24 1102       Check-In   Supervising physician immediately available to respond to emergencies See telemetry face sheet for immediately available ER MD    Location [facility]-Cardiac & Pulmonary Rehab    Staff Present Sherle Dire, BS, Exercise Physiologist;Jason Martina Sledge RDN,LDN;Cloris Flippo RN,BSN,MPA;Joseph Lacinda Pica RCP,RRT,BSRT    Virtual Visit No    Medication changes reported     No    Fall or balance concerns reported    No    Warm-up and Cool-down Performed on first and last piece of equipment    Resistance Training Performed Yes    VAD Patient? No    PAD/SET Patient? No      Pain Assessment   Currently in Pain? No/denies                Social History   Tobacco Use  Smoking Status Never  Smokeless Tobacco Never    Goals Met:  Independence with exercise equipment Exercise tolerated well No report of concerns or symptoms today Strength training completed today  Goals Unmet:  Not Applicable  Comments: Pt able to follow exercise prescription today without complaint.  Will continue to monitor for progression.    Dr. Firman Hughes is Medical Director for [facility] Cardiac Rehabilitation.  Dr. Fuad Aleskerov is Medical Director for [facility] Pulmonary Rehabilitation.

## 2024-05-26 NOTE — Progress Notes (Signed)
 Cardiac Individual Treatment Plan  Patient Details  Name: YEZENIA FREDRICK MRN: [id number] Date of Birth: 08-Apr-1951 Referring Provider:   Flowsheet Row Cardiac Rehab from 04/08/2024 in [facility] Cardiac and Pulmonary Rehab  Referring Provider Belva Boyden, MD       Initial Encounter Date:  Flowsheet Row Cardiac Rehab from 04/08/2024 in [facility] Cardiac and Pulmonary Rehab  Date 04/08/24       Visit Diagnosis: NSTEMI (non-ST elevation myocardial infarction) [facility])  Status post coronary artery stent placement  Patient's Home Medications on Admission:  Current Outpatient Medications:    acetaminophen  (TYLENOL ) 500 MG tablet, Take 500 mg by mouth every 6 (six) hours as needed for mild pain (pain score 1-3) or fever., Disp: , Rfl:    aspirin  81 MG chewable tablet, Chew 1 tablet (81 mg total) by mouth daily., Disp: 30 tablet, Rfl: 12   calcium carbonate (OSCAL) 1500 (600 Ca) MG TABS tablet, Take 600 mg of elemental calcium by mouth 2 (two) times daily with a meal., Disp: , Rfl:    cetirizine (ZYRTEC) 10 MG tablet, Take 10 mg by mouth daily., Disp: , Rfl:    diphenhydrAMINE (BENADRYL) 25 MG tablet, Take 25 mg by mouth every 6 (six) hours as needed., Disp: , Rfl:    docusate sodium (COLACE) 50 MG capsule, Take 50 mg by mouth 2 (two) times daily as needed for mild constipation., Disp: , Rfl:    Evolocumab  (REPATHA  SURECLICK) 140 MG/ML SOAJ, Inject 140 mg into the skin every 14 (fourteen) days., Disp: 6 mL, Rfl: 3   ezetimibe  (ZETIA ) 10 MG tablet, Take 1 tablet by mouth daily., Disp: , Rfl:    glucosamine-chondroitin 500-400 MG tablet, Take 1 tablet by mouth 2 (two) times daily., Disp: , Rfl:    losartan  (COZAAR ) 25 MG tablet, Take 1 tablet (25 mg total) by mouth daily., Disp: 100 tablet, Rfl: 3   magnesium oxide (MAG-OX) 400 MG tablet, Take 400 mg by mouth daily., Disp: , Rfl:    metoprolol  succinate (TOPROL -XL) 25 MG 24 hr tablet, Take 0.5 tablets (12.5 mg total) by mouth daily., Disp: 45  tablet, Rfl: 3   Multiple Vitamin (MULTI-VITAMIN) tablet, Take 1 tablet by mouth daily., Disp: , Rfl:    psyllium (METAMUCIL) 58.6 % packet, Take 1 packet by mouth 2 (two) times daily., Disp: , Rfl:    spironolactone  (ALDACTONE ) 25 MG tablet, Take 0.5 tablets (12.5 mg total) by mouth daily., Disp: 15 tablet, Rfl: 5   ticagrelor  (BRILINTA ) 90 MG TABS tablet, Take 1 tablet (90 mg total) by mouth 2 (two) times daily., Disp: 60 tablet, Rfl: 5   zinc gluconate 50 MG tablet, Take 50 mg by mouth daily., Disp: , Rfl:   Past Medical History: Past Medical History:  Diagnosis Date   Eczema    Heart murmur    Hyperlipidemia    Hypertension    Plantar fasciitis     Tobacco Use: Social History   Tobacco Use  Smoking Status Never  Smokeless Tobacco Never    Labs: Review Flowsheet       Latest Ref Rng & Units 01/02/2024  Labs for ITP Cardiac and Pulmonary Rehab  Cholestrol 0 - 200 mg/dL 621   LDL (calc) 0 - 99 mg/dL 73   HDL-C >30 mg/dL 80   Trlycerides <865 mg/dL 51      Exercise Target Goals: Exercise Program Goal: Individual exercise prescription set using results from initial 6 min walk test and THRR while considering  patient's activity  barriers and safety.   Exercise Prescription Goal: Initial exercise prescription builds to 30-45 minutes a day of aerobic activity, 2-3 days per week.  Home exercise guidelines will be given to patient during program as part of exercise prescription that the participant will acknowledge.   Education: Aerobic Exercise: - Group verbal and visual presentation on the components of exercise prescription. Introduces F.I.T.T principle from ACSM for exercise prescriptions.  Reviews F.I.T.T. principles of aerobic exercise including progression. Written material given at graduation.   Education: Resistance Exercise: - Group verbal and visual presentation on the components of exercise prescription. Introduces F.I.T.T principle from ACSM for exercise  prescriptions  Reviews F.I.T.T. principles of resistance exercise including progression. Written material given at graduation.    Education: Exercise & Equipment Safety: - Individual verbal instruction and demonstration of equipment use and safety with use of the equipment. Flowsheet Row Cardiac Rehab from 05/05/2024 in [facility] Cardiac and Pulmonary Rehab  Date 04/08/24  Educator MB  Instruction Review Code 1- Verbalizes Understanding       Education: Exercise Physiology & General Exercise Guidelines: - Group verbal and written instruction with models to review the exercise physiology of the cardiovascular system and associated critical values. Provides general exercise guidelines with specific guidelines to those with heart or lung disease.  Flowsheet Row Cardiac Rehab from 05/05/2024 in [facility] Cardiac and Pulmonary Rehab  Date 05/05/24  Educator NT  Instruction Review Code 1- [employer] Understanding       Education: Flexibility, Balance, Mind/Body Relaxation: - Group verbal and visual presentation with interactive activity on the components of exercise prescription. Introduces F.I.T.T principle from ACSM for exercise prescriptions. Reviews F.I.T.T. principles of flexibility and balance exercise training including progression. Also discusses the mind body connection.  Reviews various relaxation techniques to help reduce and manage stress (i.e. Deep breathing, progressive muscle relaxation, and visualization). Balance handout provided to take home. Written material given at graduation.   Activity Barriers & Risk Stratification:  Activity Barriers & Cardiac Risk Stratification - 04/08/24 1334       Activity Barriers & Cardiac Risk Stratification   Activity Barriers Joint Problems;Arthritis   bad knees (full of arthritis)   Cardiac Risk Stratification Moderate             6 Minute Walk:  6 Minute Walk     Row Name 04/08/24 1333         6 Minute Walk   Phase Initial      Distance 1470 feet     Walk Time 6 minutes     # of Rest Breaks 0     MPH 2.78     METS 3.48     RPE 13     Perceived Dyspnea  1     VO2 Peak 12.17     Symptoms No     Resting HR 71 bpm     Resting BP 146/72     Resting Oxygen Saturation  99 %     Exercise Oxygen Saturation  during 6 min walk 100 %     Max Ex. HR 117 bpm     Max Ex. BP 170/70     2 Minute Post BP 128/70              Oxygen Initial Assessment:   Oxygen Re-Evaluation:   Oxygen Discharge (Final Oxygen Re-Evaluation):   Initial Exercise Prescription:  Initial Exercise Prescription - 04/08/24 1300       Date of Initial Exercise RX and Referring Provider  Date 04/08/24    Referring Provider Belva Boyden, MD      Oxygen   Maintain Oxygen Saturation 88% or higher      Treadmill   MPH 2.7    Grade 0    Minutes 15    METs 3.07      Recumbant Bike   Level 3    RPM 50    Watts 32    Minutes 15    METs 3.48      NuStep   Level 3    SPM 80    Minutes 15    METs 3.48      REL-XR   Level 2    Speed 50    Minutes 15    METs 3.48      T5 Nustep   Level 3    SPM 80    Minutes 15    METs 3.48      Track   Laps 39    Minutes 15    METs 3.12      Prescription Details   Frequency (times per week) 3    Duration Progress to 30 minutes of continuous aerobic without signs/symptoms of physical distress      Intensity   THRR 40-80% of Max Heartrate 101-131    Ratings of Perceived Exertion 11-13    Perceived Dyspnea 0-4      Progression   Progression Continue to progress workloads to maintain intensity without signs/symptoms of physical distress.      Resistance Training   Training Prescription Yes    Weight 4 lb    Reps 10-15             Perform Capillary Blood Glucose checks as needed.  Exercise Prescription Changes:   Exercise Prescription Changes     Row Name 04/08/24 1300 04/27/24 1400 05/10/24 1400 05/24/24 1600       Response to Exercise   Blood Pressure  (Admit) 146/72 122/70 112/60 122/60    Blood Pressure (Exercise) 170/70 162/78 160/70 --    Blood Pressure (Exit) 128/70 102/60 112/62 124/60    Heart Rate (Admit) 71 bpm 88 bpm 77 bpm 67 bpm    Heart Rate (Exercise) 117 bpm 125 bpm 122 bpm 131 bpm    Heart Rate (Exit) 74 bpm 85 bpm 86 bpm 92 bpm    Oxygen Saturation (Admit) 99 % -- -- --    Oxygen Saturation (Exercise) 100 % -- -- --    Oxygen Saturation (Exit) 100 % -- -- --    Rating of Perceived Exertion (Exercise) 13 14 13 13     Perceived Dyspnea (Exercise) 1 0 0 --    Symptoms none none none none    Comments results first 2 weeks of exercise -- --    Duration Progress to 30 minutes of  aerobic without signs/symptoms of physical distress Progress to 30 minutes of  aerobic without signs/symptoms of physical distress Progress to 30 minutes of  aerobic without signs/symptoms of physical distress Continue with 30 min of aerobic exercise without signs/symptoms of physical distress.    Intensity THRR New THRR unchanged THRR unchanged THRR unchanged      Progression   Progression Continue to progress workloads to maintain intensity without signs/symptoms of physical distress. Continue to progress workloads to maintain intensity without signs/symptoms of physical distress. Continue to progress workloads to maintain intensity without signs/symptoms of physical distress. Continue to progress workloads to maintain intensity without signs/symptoms of physical distress.  Average METs 3.48 2.75 3.06 4.1      Resistance Training   Training Prescription -- Yes Yes Yes    Weight -- 4 4 4  lb    Reps -- 10-15 10-15 10-15      Interval Training   Interval Training -- No No No      Treadmill   MPH -- 2.8 3 3     Grade -- 0 0 6.5    Minutes -- 15 15 15     METs -- 3.14 3.3 5.99      NuStep   Level -- 3 5 4     Minutes -- 15 15 15     METs -- 3 4 3       REL-XR   Level -- -- -- 2    Minutes -- -- -- 15    METs -- -- -- 5.3      T5 Nustep    Level -- 3 3 3     Minutes -- 15 15 15     METs -- 2.4 2.4 2.6      Oxygen   Maintain Oxygen Saturation -- 88% or higher 88% or higher 88% or higher             Exercise Comments:   Exercise Comments     Row Name 04/14/24 1136           Exercise Comments First full day of exercise!  Patient was oriented to gym and equipment including functions, settings, policies, and procedures.  Patient's individual exercise prescription and treatment plan were reviewed.  All starting workloads were established based on the results of the 6 minute walk test done at initial orientation visit.  The plan for exercise progression was also introduced and progression will be customized based on patient's performance and goals.                Exercise Goals and Review:   Exercise Goals     Row Name 04/08/24 1338             Exercise Goals   Increase Physical Activity Yes       Intervention Provide advice, education, support and counseling about physical activity/exercise needs.;Develop an individualized exercise prescription for aerobic and resistive training based on initial evaluation findings, risk stratification, comorbidities and participant's personal goals.       Expected Outcomes Short Term: Attend rehab on a regular basis to increase amount of physical activity.;Long Term: Add in home exercise to make exercise part of routine and to increase amount of physical activity.;Long Term: Exercising regularly at least 3-5 days a week.       Increase Strength and Stamina Yes       Intervention Provide advice, education, support and counseling about physical activity/exercise needs.;Develop an individualized exercise prescription for aerobic and resistive training based on initial evaluation findings, risk stratification, comorbidities and participant's personal goals.       Expected Outcomes Short Term: Increase workloads from initial exercise prescription for resistance, speed, and  METs.;Short Term: Perform resistance training exercises routinely during rehab and add in resistance training at home;Long Term: Improve cardiorespiratory fitness, muscular endurance and strength as measured by increased METs and functional capacity ( )       Able to understand and use rate of perceived exertion (RPE) scale Yes       Intervention Provide education and explanation on how to use RPE scale       Expected Outcomes Short Term: Able to use RPE daily in  rehab to express subjective intensity level;Long Term:  Able to use RPE to guide intensity level when exercising independently       Able to understand and use Dyspnea scale Yes       Intervention Provide education and explanation on how to use Dyspnea scale       Expected Outcomes Short Term: Able to use Dyspnea scale daily in rehab to express subjective sense of shortness of breath during exertion;Long Term: Able to use Dyspnea scale to guide intensity level when exercising independently       Knowledge and understanding of Target Heart Rate Range (THRR) Yes       Intervention Provide education and explanation of THRR including how the numbers were predicted and where they are located for reference       Expected Outcomes Short Term: Able to state/look up THRR;Short Term: Able to use daily as guideline for intensity in rehab;Long Term: Able to use THRR to govern intensity when exercising independently       Able to check pulse independently Yes       Intervention Provide education and demonstration on how to check pulse in carotid and radial arteries.;Review the importance of being able to check your own pulse for safety during independent exercise       Expected Outcomes Short Term: Able to explain why pulse checking is important during independent exercise;Long Term: Able to check pulse independently and accurately       Understanding of Exercise Prescription Yes       Intervention Provide education, explanation, and written materials  on patient's individual exercise prescription       Expected Outcomes Short Term: Able to explain program exercise prescription;Long Term: Able to explain home exercise prescription to exercise independently                Exercise Goals Re-Evaluation :  Exercise Goals Re-Evaluation     Row Name 04/14/24 1135 04/27/24 1440 05/10/24 1439 05/24/24 1620       Exercise Goal Re-Evaluation   Exercise Goals Review Increase Strength and Stamina;Able to understand and use rate of perceived exertion (RPE) scale;Able to understand and use Dyspnea scale;Understanding of Exercise Prescription;Knowledge and understanding of Target Heart Rate Range (THRR) Increase Physical Activity;Increase Strength and Stamina;Understanding of Exercise Prescription Increase Physical Activity;Increase Strength and Stamina;Understanding of Exercise Prescription Increase Physical Activity;Increase Strength and Stamina;Understanding of Exercise Prescription    Comments Reviewed RPE and dyspnea scale, THR and program prescription with pt today.  Pt voiced understanding and was given a copy of goals to take home. Juliet is off to a great start in the program. She was able to attend her first few sessions during this weeks review period. During her sessions she was able to increase from 2.7 to 2. with no incline. We will continue to monitor her progress in the program. Trystyn is doing well in rehab. She was able to increase her speed on the treadmill from 2.8 to 3 mph. She was also able to increase from level 3 to 5 on the T4 nustep. We will continue to monitor his progress in the program. Kalianne continues to do well in rehab. She increased her treadmill workload by adding incline at 6.5% and maintaining a speed of 3 mph. She also continues to work at level 4 on the T4 nustep, level 3 on the T5 nustep, and level 2 on the XR. We will continue to monitor her progress in the program.    Expected Outcomes  Short: Use RPE daily to regulate  intensity. Long: Follow program prescription in THR. Short: Continue to follow exercise prescription. Long: Continue exercise to improve strength and stamina. Short: Continue to follow exercise prescription. Long: Continue exercise to improve strength and stamina. Short: Progressively increase workloads on seated machines. Long: Continue exercise to improve strength and stamina.             Discharge Exercise Prescription (Final Exercise Prescription Changes):  Exercise Prescription Changes - 05/24/24 1600       Response to Exercise   Blood Pressure (Admit) 122/60    Blood Pressure (Exit) 124/60    Heart Rate (Admit) 67 bpm    Heart Rate (Exercise) 131 bpm    Heart Rate (Exit) 92 bpm    Rating of Perceived Exertion (Exercise) 13    Symptoms none    Duration Continue with 30 min of aerobic exercise without signs/symptoms of physical distress.    Intensity THRR unchanged      Progression   Progression Continue to progress workloads to maintain intensity without signs/symptoms of physical distress.    Average METs 4.1      Resistance Training   Training Prescription Yes    Weight 4 lb    Reps 10-15      Interval Training   Interval Training No      Treadmill   MPH 3    Grade 6.5    Minutes 15    METs 5.99      NuStep   Level 4    Minutes 15    METs 3      REL-XR   Level 2    Minutes 15    METs 5.3      T5 Nustep   Level 3    Minutes 15    METs 2.6      Oxygen   Maintain Oxygen Saturation 88% or higher             Nutrition:  Target Goals: Understanding of nutrition guidelines, daily intake of sodium 1500mg , cholesterol 200mg , calories 30% from fat and 7% or less from saturated fats, daily to have 5 or more servings of fruits and vegetables.  Education: All About Nutrition: -Group instruction provided by verbal, written material, interactive activities, discussions, models, and posters to present general guidelines for heart healthy nutrition  including fat, fiber, MyPlate, the role of sodium in heart healthy nutrition, utilization of the nutrition label, and utilization of this knowledge for meal planning. Follow up email sent as well. Written material given at graduation.   Biometrics:  Pre Biometrics - 04/08/24 1338       Pre Biometrics   Height 5' 2.64" (1.591 m)    Weight 150 lb 6.4 oz (68.2 kg)    Waist Circumference 34 inches    Hip Circumference 39.3 inches    Waist to Hip Ratio 0.87 %    BMI (Calculated) 26.95    Single Leg Stand 5.5 seconds              Nutrition Therapy Plan and Nutrition Goals:  Nutrition Therapy & Goals - 04/26/24 1114       Nutrition Therapy   Diet Cardiac, Low Na    Protein (specify units) 75    Fiber 25 grams    Whole Grain Foods 3 servings    Saturated Fats 15 max. grams    Fruits and Vegetables 5 servings/day    Sodium 2 grams      Personal  Nutrition Goals   Nutrition Goal Eat 15-30gProtein and 30-60gCarbs at each meal.    Personal Goal #2 Read labels and reduce sodium intake to below 2300mg . Ideally 1500mg  per day.    Personal Goal #3 Reduce saturated fat, less than 12g per day. Replace bad fats for more heart healthy fats.    Comments Payden reports she was eating eggs and bacon, ice cream and other food that were not heart healthy but has been working on making changes and feels she is doing much better. Spoke with her today about what makes foods heart healthy. She is quite knowledgeable of heart healthy eating. Provided her with a guideline limit of less than 12g saturated fat per day and less than 1500mg  Sodium per day. Reviewed her recall, she is eating 3 times per day sometimes has a snack. She has been doing better with eating more lean meats and veggies. She has several veggies growing in her garden. She uses whole grain bread. Reviewed mediterranean diet handout. Educated on types of fats, sources, and how to read labels. Reviewed several labels together and brainstormed  meals and snacks with foods she likes and will eat.      Intervention Plan   Intervention Prescribe, educate and counsel regarding individualized specific dietary modifications aiming towards targeted core components such as weight, hypertension, lipid management, diabetes, heart failure and other comorbidities.;Nutrition handout(s) given to patient.    Expected Outcomes Short Term Goal: Understand basic principles of dietary content, such as calories, fat, sodium, cholesterol and nutrients.;Short Term Goal: A plan has been developed with personal nutrition goals set during dietitian appointment.;Long Term Goal: Adherence to prescribed nutrition plan.             Nutrition Assessments:  MEDIFICTS Score Key: >=70 Need to make dietary changes  40-70 Heart Healthy Diet <= 40 Therapeutic Level Cholesterol Diet  Flowsheet Row Cardiac Rehab from 04/14/2024 in [facility] Cardiac and Pulmonary Rehab  Picture Your Plate Total Score on Admission 79      Picture Your Plate Scores: <81 Unhealthy dietary pattern with much room for improvement. 41-50 Dietary pattern unlikely to meet recommendations for good health and room for improvement. 51-60 More healthful dietary pattern, with some room for improvement.  >60 Healthy dietary pattern, although there may be some specific behaviors that could be improved.    Nutrition Goals Re-Evaluation:  Nutrition Goals Re-Evaluation     Row Name 05/05/24 1114             Goals   Nutrition Goal Eat 15-30gProtein and 30-60gCarbs at each meal.       Comment Symphani reports that she is reading food labels and is trying to reduce sodium intake. She uses a salt substitute, lemon juice, and other herbs and spices to flavor her food. She is trying to incorporate lean protein like fish and chicken into her diet, and only occationally eat beef. She reports that she is able to meet her nutritional needs and is trying to make heart healthy changes.       Expected Outcome  Short: continue to read food labels and reduce sodium intake. Long: maintain heart healthty diet.         Personal Goal #2 Re-Evaluation   Personal Goal #2 Read labels and reduce sodium intake to below 2300mg . Ideally 1500mg  per day.         Personal Goal #3 Re-Evaluation   Personal Goal #3 Reduce saturated fat, less than 12g per day. Replace bad fats for  more heart healthy fats.                Nutrition Goals Discharge (Final Nutrition Goals Re-Evaluation):  Nutrition Goals Re-Evaluation - 05/05/24 1114       Goals   Nutrition Goal Eat 15-30gProtein and 30-60gCarbs at each meal.    Comment Karmela reports that she is reading food labels and is trying to reduce sodium intake. She uses a salt substitute, lemon juice, and other herbs and spices to flavor her food. She is trying to incorporate lean protein like fish and chicken into her diet, and only occationally eat beef. She reports that she is able to meet her nutritional needs and is trying to make heart healthy changes.    Expected Outcome Short: continue to read food labels and reduce sodium intake. Long: maintain heart healthty diet.      Personal Goal #2 Re-Evaluation   Personal Goal #2 Read labels and reduce sodium intake to below 2300mg . Ideally 1500mg  per day.      Personal Goal #3 Re-Evaluation   Personal Goal #3 Reduce saturated fat, less than 12g per day. Replace bad fats for more heart healthy fats.             Psychosocial: Target Goals: Acknowledge presence or absence of significant depression and/or stress, maximize coping skills, provide positive support system. Participant is able to verbalize types and ability to use techniques and skills needed for reducing stress and depression.   Education: Stress, Anxiety, and Depression - Group verbal and visual presentation to define topics covered.  Reviews how body is impacted by stress, anxiety, and depression.  Also discusses healthy ways to reduce stress and to  treat/manage anxiety and depression.  Written material given at graduation. Flowsheet Row Cardiac Rehab from 05/05/2024 in [facility] Cardiac and Pulmonary Rehab  Date 04/28/24  Educator [facility]  Instruction Review Code 1- [employer] Understanding       Education: Sleep Hygiene -Provides group verbal and written instruction about how sleep can affect your health.  Define sleep hygiene, discuss sleep cycles and impact of sleep habits. Review good sleep hygiene tips.    Initial Review & Psychosocial Screening:  Initial Psych Review & Screening - 04/06/24 1357       Initial Review   Current issues with None Identified      Family Dynamics   Good Support System? Yes   2 daughters, friends     Barriers   Psychosocial barriers to participate in program There are no identifiable barriers or psychosocial needs.      Screening Interventions   Interventions Encouraged to exercise;To provide support and resources with identified psychosocial needs;Provide feedback about the scores to participant    Expected Outcomes Short Term goal: Utilizing psychosocial counselor, staff and physician to assist with identification of specific Stressors or current issues interfering with healing process. Setting desired goal for each stressor or current issue identified.;Long Term Goal: Stressors or current issues are controlled or eliminated.;Short Term goal: Identification and review with participant of any Quality of Life or Depression concerns found by scoring the questionnaire.;Long Term goal: The participant improves quality of Life and PHQ9 Scores as seen by post scores and/or verbalization of changes             Quality of Life Scores:   Quality of Life - 04/14/24 1537       Quality of Life   Select Quality of Life      Quality of Life Scores  Health/Function Pre 26.3 %    Socioeconomic Pre 30 %    Psych/Spiritual Pre 30 %    Family Pre 28.5 %    GLOBAL Pre 28.24 %            Scores of 19 and  below usually indicate a poorer quality of life in these areas.  A difference of  2-3 points is a clinically meaningful difference.  A difference of 2-3 points in the total score of the Quality of Life Index has been associated with significant improvement in overall quality of life, self-image, physical symptoms, and general health in studies assessing change in quality of life.  PHQ-9: Review Flowsheet       04/08/2024  Depression screen PHQ 2/9  Decreased Interest 0  Down, Depressed, Hopeless 0  PHQ - 2 Score 0  Altered sleeping 0  Tired, decreased energy 1  Change in appetite 0  Feeling bad or failure about yourself  0  Trouble concentrating 0  Moving slowly or fidgety/restless 0  Suicidal thoughts 0  PHQ-9 Score 1  Difficult doing work/chores Not difficult at all   Interpretation of Total Score  Total Score Depression Severity:  1-4 = Minimal depression, 5-9 = Mild depression, 10-14 = Moderate depression, 15-19 = Moderately severe depression, 20-27 = Severe depression   Psychosocial Evaluation and Intervention:  Psychosocial Evaluation - 04/06/24 1413       Psychosocial Evaluation & Interventions   Interventions Encouraged to exercise with the program and follow exercise prescription    Comments There are no barriers to attending the program.  Her 2 daughters are her support. She is ready to get strted with the program.    Expected Outcomes STG attend all scheduled sessions, work on exercise progression while in the program  LTG continue with exercise progression and utilize resources found and taught at program    Continue Psychosocial Services  Follow up required by staff             Psychosocial Re-Evaluation:  Psychosocial Re-Evaluation     Row Name 05/05/24 1121             Psychosocial Re-Evaluation   Current issues with None Identified       Comments Cypress reports that she does not currently have any stress, sleep, or mental health concerns. She reports  that she has a good support system. She sleeps will at night and feels rested.       Expected Outcomes Short: continue to attend cardiac rehab consistently for mental health benefits of exercise. Long: maintain good mental health routine and good supportive relationships.       Interventions Encouraged to attend Cardiac Rehabilitation for the exercise       Continue Psychosocial Services  Follow up required by staff                Psychosocial Discharge (Final Psychosocial Re-Evaluation):  Psychosocial Re-Evaluation - 05/05/24 1121       Psychosocial Re-Evaluation   Current issues with None Identified    Comments Yahira reports that she does not currently have any stress, sleep, or mental health concerns. She reports that she has a good support system. She sleeps will at night and feels rested.    Expected Outcomes Short: continue to attend cardiac rehab consistently for mental health benefits of exercise. Long: maintain good mental health routine and good supportive relationships.    Interventions Encouraged to attend Cardiac Rehabilitation for the exercise    Continue  Psychosocial Services  Follow up required by staff             Vocational Rehabilitation: Provide vocational rehab assistance to qualifying candidates.   Vocational Rehab Evaluation & Intervention:   Education: Education Goals: Education classes will be provided on a variety of topics geared toward better understanding of heart health and risk factor modification. Participant will state understanding/return demonstration of topics presented as noted by education test scores.  Learning Barriers/Preferences:   General Cardiac Education Topics:  AED/CPR: - Group verbal and written instruction with the use of models to demonstrate the basic use of the AED with the basic ABC's of resuscitation.   Anatomy and Cardiac Procedures: - Group verbal and visual presentation and models provide information about basic  cardiac anatomy and function. Reviews the testing methods done to diagnose heart disease and the outcomes of the test results. Describes the treatment choices: Medical Management, Angioplasty, or Coronary Bypass Surgery for treating various heart conditions including Myocardial Infarction, Angina, Valve Disease, and Cardiac Arrhythmias.  Written material given at graduation.   Medication Safety: - Group verbal and visual instruction to review commonly prescribed medications for heart and lung disease. Reviews the medication, class of the drug, and side effects. Includes the steps to properly store meds and maintain the prescription regimen.  Written material given at graduation.   Intimacy: - Group verbal instruction through game format to discuss how heart and lung disease can affect sexual intimacy. Written material given at graduation..   Know Your Numbers and Heart Failure: - Group verbal and visual instruction to discuss disease risk factors for cardiac and pulmonary disease and treatment options.  Reviews associated critical values for Overweight/Obesity, Hypertension, Cholesterol, and Diabetes.  Discusses basics of heart failure: signs/symptoms and treatments.  Introduces Heart Failure Zone chart for action plan for heart failure.  Written material given at graduation. Flowsheet Row Cardiac Rehab from 05/05/2024 in [facility] Cardiac and Pulmonary Rehab  Date 04/14/24  Educator [facility]  Instruction Review Code 1- Verbalizes Understanding       Infection Prevention: - Provides verbal and written material to individual with discussion of infection control including proper hand washing and proper equipment cleaning during exercise session. Flowsheet Row Cardiac Rehab from 05/05/2024 in [facility] Cardiac and Pulmonary Rehab  Date 04/08/24  Educator MB  Instruction Review Code 1- Verbalizes Understanding       Falls Prevention: - Provides verbal and written material to individual with discussion of  falls prevention and safety. Flowsheet Row Cardiac Rehab from 05/05/2024 in [facility] Cardiac and Pulmonary Rehab  Date 04/08/24  Educator MB  Instruction Review Code 1- Verbalizes Understanding       Other: -Provides group and verbal instruction on various topics (see comments)   Knowledge Questionnaire Score:  Knowledge Questionnaire Score - 04/14/24 1540       Knowledge Questionnaire Score   Pre Score 26             Core Components/Risk Factors/Patient Goals at Admission:  Personal Goals and Risk Factors at Admission - 04/08/24 1339       Core Components/Risk Factors/Patient Goals on Admission    Weight Management Yes;Weight Loss;Weight Maintenance    Intervention Weight Management: Develop a combined nutrition and exercise program designed to reach desired caloric intake, while maintaining appropriate intake of nutrient and fiber, sodium and fats, and appropriate energy expenditure required for the weight goal.;Weight Management: Provide education and appropriate resources to help participant work on and attain dietary goals.;Weight Management/Obesity: Establish  reasonable short term and long term weight goals.    Admit Weight 150 lb 6.4 oz (68.2 kg)    Goal Weight: Short Term 142 lb 14.4 oz (64.8 kg)    Goal Weight: Long Term 135 lb (61.2 kg)    Expected Outcomes Short Term: Continue to assess and modify interventions until short term weight is achieved;Long Term: Adherence to nutrition and physical activity/exercise program aimed toward attainment of established weight goal;Weight Maintenance: Understanding of the daily nutrition guidelines, which includes 25-35% calories from fat, 7% or less cal from saturated fats, less than 200mg  cholesterol, less than 1.5gm of sodium, & 5 or more servings of fruits and vegetables daily;Weight Loss: Understanding of general recommendations for a balanced deficit meal plan, which promotes 1-2 lb weight loss per week and includes a negative  energy balance of [phone] kcal/d;Understanding recommendations for meals to include 15-35% energy as protein, 25-35% energy from fat, 35-60% energy from carbohydrates, less than 200mg  of dietary cholesterol, 20-35 gm of total fiber daily;Understanding of distribution of calorie intake throughout the day with the consumption of 4-5 meals/snacks    Hypertension Yes    Intervention Provide education on lifestyle modifcations including regular physical activity/exercise, weight management, moderate sodium restriction and increased consumption of fresh fruit, vegetables, and low fat dairy, alcohol moderation, and smoking cessation.;Monitor prescription use compliance.    Expected Outcomes Short Term: Continued assessment and intervention until BP is < 140/8mm HG in hypertensive participants. < 130/47mm HG in hypertensive participants with diabetes, heart failure or chronic kidney disease.;Long Term: Maintenance of blood pressure at goal levels.    Lipids Yes    Intervention Provide education and support for participant on nutrition & aerobic/resistive exercise along with prescribed medications to achieve LDL 70mg , HDL >40mg .    Expected Outcomes Short Term: Participant states understanding of desired cholesterol values and is compliant with medications prescribed. Participant is following exercise prescription and nutrition guidelines.;Long Term: Cholesterol controlled with medications as prescribed, with individualized exercise RX and with personalized nutrition plan. Value goals: LDL < 70mg , HDL > 40 mg.             Education:Diabetes - Individual verbal and written instruction to review signs/symptoms of diabetes, desired ranges of glucose level fasting, after meals and with exercise. Acknowledge that pre and post exercise glucose checks will be done for 3 sessions at entry of program.   Core Components/Risk Factors/Patient Goals Review:   Goals and Risk Factor Review     Row Name 05/05/24 1117              Core Components/Risk Factors/Patient Goals Review   Personal Goals Review Lipids;Hypertension       Review Fredia reports that she takes all her blood pressure and cholesterol medication as prescibed. She monitors her BP at home 3 times a day as recommended by her cardiologist. She keeps records of these readings and has an appointment with her cardiologist on 05/18/24 and will review these results with her. She does have some soreness related to her cholesterol medication injection that she has to take every other week. She has communicated these concerns to her doctor and will work with them if changes are needed. She follows up with all doctor appointments and lab work required.       Expected Outcomes Short: continue to monitor BP at home and meet with cardiologist this month. Long: maintain good control over BP and cholesterol with the help of her medical team.  Core Components/Risk Factors/Patient Goals at Discharge (Final Review):   Goals and Risk Factor Review - 05/05/24 1117       Core Components/Risk Factors/Patient Goals Review   Personal Goals Review Lipids;Hypertension    Review Fumiko reports that she takes all her blood pressure and cholesterol medication as prescibed. She monitors her BP at home 3 times a day as recommended by her cardiologist. She keeps records of these readings and has an appointment with her cardiologist on 05/18/24 and will review these results with her. She does have some soreness related to her cholesterol medication injection that she has to take every other week. She has communicated these concerns to her doctor and will work with them if changes are needed. She follows up with all doctor appointments and lab work required.    Expected Outcomes Short: continue to monitor BP at home and meet with cardiologist this month. Long: maintain good control over BP and cholesterol with the help of her medical team.             ITP  Comments:  ITP Comments     Row Name 04/06/24 1412 04/08/24 1126 04/14/24 1136 04/28/24 1328 05/26/24 0957   ITP Comments Virtual orientation call completed today. shehas an appointment on Date: 04/08/2024  for EP eval and gym Orientation.  Documentation of diagnosis can be found in [facility] 12/31/2023 . Completed and gym orientation for cardiac rehab. Initial ITP created and sent for review to Dr. Firman Hughes, Medical Director. First full day of exercise!  Patient was oriented to gym and equipment including functions, settings, policies, and procedures.  Patient's individual exercise prescription and treatment plan were reviewed.  All starting workloads were established based on the results of the 6 minute walk test done at initial orientation visit.  The plan for exercise progression was also introduced and progression will be customized based on patient's performance and goals. 30 Day review completed. Medical Director ITP review done, changes made as directed, and signed approval by Medical Director.    new to program 30 Day review completed. Medical Director ITP review done, changes made as directed, and signed approval by Medical Director.            Comments:

## 2024-05-28 ENCOUNTER — Encounter

## 2024-05-28 DIAGNOSIS — Z48812 Encounter for surgical aftercare following surgery on the circulatory system: Secondary | ICD-10-CM | POA: Diagnosis not present

## 2024-05-28 DIAGNOSIS — I214 Non-ST elevation (NSTEMI) myocardial infarction: Secondary | ICD-10-CM

## 2024-05-28 DIAGNOSIS — Z955 Presence of coronary angioplasty implant and graft: Secondary | ICD-10-CM

## 2024-05-28 NOTE — Progress Notes (Signed)
 Daily Session Note  Patient Details  Name: Sheila Marquez MRN: [id number] Date of Birth: 07-03-1951 Referring Provider:   Flowsheet Row Cardiac Rehab from 04/08/2024 in [facility] Cardiac and Pulmonary Rehab  Referring Provider Belva Boyden, MD       Encounter Date: 05/28/2024  Check In:  Session Check In - 05/28/24 1111       Check-In   Supervising physician immediately available to respond to emergencies See telemetry face sheet for immediately available ER MD    Location [facility]-Cardiac & Pulmonary Rehab    Staff Present Sherle Dire, BS, Exercise Physiologist;Monquie Fulgham Sabra Cramp BS, ACSM CEP, Exercise Physiologist;Kemper Heupel RN,BSN,MPA;Maxon Conetta BS, Exercise Physiologist    Virtual Visit No    Medication changes reported     No    Fall or balance concerns reported    No    Warm-up and Cool-down Performed on first and last piece of equipment    Resistance Training Performed Yes    VAD Patient? No    PAD/SET Patient? No      Pain Assessment   Currently in Pain? No/denies                Social History   Tobacco Use  Smoking Status Never  Smokeless Tobacco Never    Goals Met:  Independence with exercise equipment Exercise tolerated well Personal goals reviewed No report of concerns or symptoms today Strength training completed today  Goals Unmet:  Not Applicable  Comments: Pt able to follow exercise prescription today without complaint.  Will continue to monitor for progression.  Reviewed home exercise with pt today.  Pt plans to walk  for exercise.  Reviewed THR, pulse, RPE, sign and symptoms, pulse oximetery and when to call 911 or MD.  Also discussed weather considerations and indoor options.  Pt voiced understanding.   Dr. Firman Hughes is Medical Director for [facility] Cardiac Rehabilitation.  Dr. Fuad Aleskerov is Medical Director for [facility] Pulmonary Rehabilitation.

## 2024-05-31 ENCOUNTER — Other Ambulatory Visit: Payer: Self-pay | Admitting: Internal Medicine

## 2024-05-31 ENCOUNTER — Encounter: Admitting: *Deleted

## 2024-05-31 DIAGNOSIS — Z1231 Encounter for screening mammogram for malignant neoplasm of breast: Secondary | ICD-10-CM

## 2024-05-31 DIAGNOSIS — I214 Non-ST elevation (NSTEMI) myocardial infarction: Secondary | ICD-10-CM

## 2024-05-31 DIAGNOSIS — Z955 Presence of coronary angioplasty implant and graft: Secondary | ICD-10-CM

## 2024-05-31 DIAGNOSIS — Z48812 Encounter for surgical aftercare following surgery on the circulatory system: Secondary | ICD-10-CM | POA: Diagnosis not present

## 2024-05-31 NOTE — Progress Notes (Signed)
 Daily Session Note  Patient Details  Name: Sheila Marquez MRN: [id number] Date of Birth: 21-Nov-1951 Referring Provider:   Flowsheet Row Cardiac Rehab from 04/08/2024 in [facility] Cardiac and Pulmonary Rehab  Referring Provider Belva Boyden, MD       Encounter Date: 05/31/2024  Check In:  Session Check In - 05/31/24 1121       Check-In   Supervising physician immediately available to respond to emergencies See telemetry face sheet for immediately available ER MD    Location [facility]-Cardiac & Pulmonary Rehab    Staff Present Maud Sorenson, RN, BSN, CCRP;Meredith Manson Seitz RN,BSN;Maxon Kemah BS, Exercise Physiologist;Kelly Bollinger [facility]    Virtual Visit No    Medication changes reported     No    Fall or balance concerns reported    No    Warm-up and Cool-down Performed on first and last piece of equipment    Resistance Training Performed Yes    VAD Patient? No    PAD/SET Patient? No      Pain Assessment   Currently in Pain? No/denies                Social History   Tobacco Use  Smoking Status Never  Smokeless Tobacco Never    Goals Met:  Independence with exercise equipment Exercise tolerated well No report of concerns or symptoms today  Goals Unmet:  Not Applicable  Comments: Pt able to follow exercise prescription today without complaint.  Will continue to monitor for progression.    Dr. Firman Hughes is Medical Director for [facility] Cardiac Rehabilitation.  Dr. Fuad Aleskerov is Medical Director for [facility] Pulmonary Rehabilitation.

## 2024-06-02 ENCOUNTER — Encounter: Admitting: *Deleted

## 2024-06-02 DIAGNOSIS — Z955 Presence of coronary angioplasty implant and graft: Secondary | ICD-10-CM

## 2024-06-02 DIAGNOSIS — Z48812 Encounter for surgical aftercare following surgery on the circulatory system: Secondary | ICD-10-CM | POA: Diagnosis not present

## 2024-06-02 DIAGNOSIS — I214 Non-ST elevation (NSTEMI) myocardial infarction: Secondary | ICD-10-CM

## 2024-06-02 NOTE — Progress Notes (Signed)
 Daily Session Note  Patient Details  Name: Sheila Marquez MRN: [id number] Date of Birth: July 20, 1951 Referring Provider:   Flowsheet Row Cardiac Rehab from 04/08/2024 in [facility] Cardiac and Pulmonary Rehab  Referring Provider Belva Boyden, MD       Encounter Date: 06/02/2024  Check In:  Session Check In - 06/02/24 1116       Check-In   Supervising physician immediately available to respond to emergencies See telemetry face sheet for immediately available ER MD    Location [facility]-Cardiac & Pulmonary Rehab    Staff Present Maud Sorenson, RN, BSN, CCRP;Noah Tickle, BS, Exercise Physiologist;Kelly Bollinger RN,BSN,MPA;Jason Martina Sledge RDN,LDN    Virtual Visit No    Medication changes reported     No    Fall or balance concerns reported    No    Warm-up and Cool-down Performed on first and last piece of equipment    Resistance Training Performed Yes    VAD Patient? No    PAD/SET Patient? No      Pain Assessment   Currently in Pain? No/denies                Social History   Tobacco Use  Smoking Status Never  Smokeless Tobacco Never    Goals Met:  Independence with exercise equipment Exercise tolerated well No report of concerns or symptoms today  Goals Unmet:  Not Applicable  Comments: Pt able to follow exercise prescription today without complaint.  Will continue to monitor for progression.    Dr. Firman Hughes is Medical Director for [facility] Cardiac Rehabilitation.  Dr. Fuad Aleskerov is Medical Director for [facility] Pulmonary Rehabilitation.

## 2024-06-04 ENCOUNTER — Encounter: Admitting: *Deleted

## 2024-06-04 DIAGNOSIS — Z955 Presence of coronary angioplasty implant and graft: Secondary | ICD-10-CM

## 2024-06-04 DIAGNOSIS — Z48812 Encounter for surgical aftercare following surgery on the circulatory system: Secondary | ICD-10-CM | POA: Diagnosis not present

## 2024-06-04 DIAGNOSIS — I214 Non-ST elevation (NSTEMI) myocardial infarction: Secondary | ICD-10-CM

## 2024-06-04 NOTE — Progress Notes (Signed)
 Daily Session Note  Patient Details  Name: Sheila Marquez MRN: [id number] Date of Birth: Apr 15, 1951 Referring Provider:   Flowsheet Row Cardiac Rehab from 04/08/2024 in [facility] Cardiac and Pulmonary Rehab  Referring Provider Belva Boyden, MD    Encounter Date: 06/04/2024  Check In:  Session Check In - 06/04/24 1136       Check-In   Supervising physician immediately available to respond to emergencies See telemetry face sheet for immediately available ER MD    Location [facility]-Cardiac & Pulmonary Rehab    Staff Present Maud Sorenson, RN, BSN, CCRP;Meredith Manson Seitz RN,BSN;Maxon [employer], Exercise Physiologist;Noah Tickle, BS, Exercise Physiologist    Virtual Visit No    Medication changes reported     No    Fall or balance concerns reported    No    Warm-up and Cool-down Performed on first and last piece of equipment    Resistance Training Performed Yes    VAD Patient? No    PAD/SET Patient? No      Pain Assessment   Currently in Pain? No/denies             Social History   Tobacco Use  Smoking Status Never  Smokeless Tobacco Never    Goals Met:  Independence with exercise equipment Exercise tolerated well No report of concerns or symptoms today  Goals Unmet:  Not Applicable  Comments: Pt able to follow exercise prescription today without complaint.  Will continue to monitor for progression.    Dr. Firman Hughes is Medical Director for [facility] Cardiac Rehabilitation.  Dr. Fuad Aleskerov is Medical Director for [facility] Pulmonary Rehabilitation.

## 2024-06-07 ENCOUNTER — Encounter: Admitting: *Deleted

## 2024-06-07 DIAGNOSIS — I214 Non-ST elevation (NSTEMI) myocardial infarction: Secondary | ICD-10-CM

## 2024-06-07 DIAGNOSIS — Z48812 Encounter for surgical aftercare following surgery on the circulatory system: Secondary | ICD-10-CM | POA: Diagnosis not present

## 2024-06-07 DIAGNOSIS — Z955 Presence of coronary angioplasty implant and graft: Secondary | ICD-10-CM

## 2024-06-07 NOTE — Progress Notes (Signed)
 Daily Session Note  Patient Details  Name: Sheila Marquez MRN: [id number] Date of Birth: [birth year] Referring Provider:   Flowsheet Row Cardiac Rehab from 04/08/2024 in [facility] Cardiac and Pulmonary Rehab  Referring Provider Belva Boyden, MD    Encounter Date: 06/07/2024  Check In:  Session Check In - 06/07/24 1120       Check-In   Supervising physician immediately available to respond to emergencies See telemetry face sheet for immediately available ER MD    Location [facility]-Cardiac & Pulmonary Rehab    Staff Present Maud Sorenson, RN, BSN, CCRP;Joseph Hood RCP,RRT,BSRT;Maxon [employer], Exercise Physiologist;Kelly [employer], ACSM CEP, Exercise Physiologist    Virtual Visit No    Medication changes reported     No    Fall or balance concerns reported    No    Warm-up and Cool-down Performed on first and last piece of equipment    Resistance Training Performed Yes    VAD Patient? No    PAD/SET Patient? No      Pain Assessment   Currently in Pain? No/denies             Social History   Tobacco Use  Smoking Status Never  Smokeless Tobacco Never    Goals Met:  Independence with exercise equipment Exercise tolerated well No report of concerns or symptoms today  Goals Unmet:  Not Applicable  Comments: Pt able to follow exercise prescription today without complaint.  Will continue to monitor for progression.    Dr. Firman Hughes is Medical Director for [facility] Cardiac Rehabilitation.  Dr. Fuad Aleskerov is Medical Director for [facility] Pulmonary Rehabilitation.

## 2024-06-09 ENCOUNTER — Encounter: Admitting: *Deleted

## 2024-06-09 DIAGNOSIS — Z955 Presence of coronary angioplasty implant and graft: Secondary | ICD-10-CM

## 2024-06-09 DIAGNOSIS — Z48812 Encounter for surgical aftercare following surgery on the circulatory system: Secondary | ICD-10-CM | POA: Diagnosis not present

## 2024-06-09 DIAGNOSIS — I214 Non-ST elevation (NSTEMI) myocardial infarction: Secondary | ICD-10-CM

## 2024-06-09 NOTE — Progress Notes (Signed)
 Daily Session Note  Patient Details  Name: Sheila Marquez MRN: [id number] Date of Birth: 1951/06/07 Referring Provider:   Flowsheet Row Cardiac Rehab from 04/08/2024 in [facility] Cardiac and Pulmonary Rehab  Referring Provider Belva Boyden, MD    Encounter Date: 06/09/2024  Check In:  Session Check In - 06/09/24 1132       Check-In   Supervising physician immediately available to respond to emergencies See telemetry face sheet for immediately available ER MD    Location [facility]-Cardiac & Pulmonary Rehab    Staff Present Maud Sorenson, RN, BSN, CCRP;Joseph Hood RCP,RRT,BSRT;Maxon Naches BS, Exercise Physiologist;Jason Martina Sledge RDN,LDN    Virtual Visit No    Medication changes reported     No    Fall or balance concerns reported    No    Warm-up and Cool-down Performed on first and last piece of equipment    Resistance Training Performed Yes    VAD Patient? No    PAD/SET Patient? No      Pain Assessment   Currently in Pain? No/denies             Social History   Tobacco Use  Smoking Status Never  Smokeless Tobacco Never    Goals Met:  Independence with exercise equipment Exercise tolerated well No report of concerns or symptoms today  Goals Unmet:  Not Applicable  Comments: Pt able to follow exercise prescription today without complaint.  Will continue to monitor for progression.    Dr. Firman Hughes is Medical Director for [facility] Cardiac Rehabilitation.  Dr. Fuad Aleskerov is Medical Director for [facility] Pulmonary Rehabilitation.

## 2024-06-11 ENCOUNTER — Encounter: Admitting: *Deleted

## 2024-06-11 VITALS — Ht 62.64 in | Wt 148.8 lb

## 2024-06-11 DIAGNOSIS — Z48812 Encounter for surgical aftercare following surgery on the circulatory system: Secondary | ICD-10-CM | POA: Diagnosis not present

## 2024-06-11 DIAGNOSIS — Z955 Presence of coronary angioplasty implant and graft: Secondary | ICD-10-CM

## 2024-06-11 DIAGNOSIS — I214 Non-ST elevation (NSTEMI) myocardial infarction: Secondary | ICD-10-CM

## 2024-06-11 NOTE — Patient Instructions (Signed)
 Discharge Patient Instructions  Patient Details  Name: Sheila Marquez MRN: [id number] Date of Birth: [birth year] Referring Provider:  Rex Castor, MD   Number of Visits: 47  Reason for Discharge:  Patient reached a stable level of exercise. Patient independent in their exercise. Patient has met program and personal goals.  Diagnosis:  NSTEMI (non-ST elevation myocardial infarction) [name])  Status post coronary artery stent placement  Initial Exercise Prescription:  Initial Exercise Prescription - 04/08/24 1300       Date of Initial Exercise RX and Referring Provider   Date 04/08/24    Referring Provider Belva Boyden, MD      Oxygen   Maintain Oxygen Saturation 88% or higher      Treadmill   MPH 2.7    Grade 0    Minutes 15    METs 3.07      Recumbant Bike   Level 3    RPM 50    Watts 32    Minutes 15    METs 3.48      NuStep   Level 3    SPM 80    Minutes 15    METs 3.48      REL-XR   Level 2    Speed 50    Minutes 15    METs 3.48      T5 Nustep   Level 3    SPM 80    Minutes 15    METs 3.48      Track   Laps 39    Minutes 15    METs 3.12      Prescription Details   Frequency (times per week) 3    Duration Progress to 30 minutes of continuous aerobic without signs/symptoms of physical distress      Intensity   THRR 40-80% of Max Heartrate 101-131    Ratings of Perceived Exertion 11-13    Perceived Dyspnea 0-4      Progression   Progression Continue to progress workloads to maintain intensity without signs/symptoms of physical distress.      Resistance Training   Training Prescription Yes    Weight 4 lb    Reps 10-15          Discharge Exercise Prescription (Final Exercise Prescription Changes):  Exercise Prescription Changes - 06/08/24 1400       Response to Exercise   Blood Pressure (Admit) 124/68    Blood Pressure (Exercise) 140/70    Blood Pressure (Exit) 108/56    Heart Rate (Admit) 84 bpm    Heart Rate  (Exercise) 111 bpm    Heart Rate (Exit) 75 bpm    Oxygen Saturation (Admit) 95 %    Oxygen Saturation (Exercise) 91 %    Oxygen Saturation (Exit) 94 %    Rating of Perceived Exertion (Exercise) 16    Perceived Dyspnea (Exercise) 0    Symptoms none    Duration Continue with 30 min of aerobic exercise without signs/symptoms of physical distress.    Intensity THRR unchanged      Progression   Progression Continue to progress workloads to maintain intensity without signs/symptoms of physical distress.    Average METs 3.38      Resistance Training   Training Prescription Yes    Weight 4 lb    Reps 10-15      Interval Training   Interval Training No      Treadmill   MPH 2.9    Grade 1.5    Minutes 15  METs 3.82      Recumbant Bike   Level 4    Watts 32    Minutes 15    METs 3.49      NuStep   Level 5    Minutes 15    METs 3.8      T5 Nustep   Level 3    Minutes 15    METs 2.7      Biostep-RELP   Level 3    SPM 50    Minutes 15    METs 3      Track   Laps 60   Hallway   Minutes 15    METs 3.61      Home Exercise Plan   Plans to continue exercise at Home (comment)   walk   Frequency Add 2 additional days to program exercise sessions.    Initial Home Exercises Provided 05/28/24      Oxygen   Maintain Oxygen Saturation 88% or higher          Functional Capacity:  6 Minute Walk     Row Name 04/08/24 1333 06/11/24 1114       6 Minute Walk   Phase Initial Discharge    Distance 1470 feet 1705 feet    Distance % Change -- 15.98 %    Distance Feet Change -- 235 ft    Walk Time 6 minutes 6 minutes    # of Rest Breaks 0 0    MPH 2.78 3.23    METS 3.48 3.73    RPE 13 11    Perceived Dyspnea  1 0    VO2 Peak 12.17 13.07    Symptoms No No    Resting HR 71 bpm 76 bpm    Resting BP 146/72 124/62    Resting Oxygen Saturation  99 % 96 %    Exercise Oxygen Saturation  during 6 min walk 100 % 94 %    Max Ex. HR 117 bpm 116 bpm    Max Ex. BP 170/70  152/72    2 Minute Post BP 128/70 --      Nutrition & Weight - Outcomes:  Pre Biometrics - 04/08/24 1338       Pre Biometrics   Height 5' 2.64 (1.591 m)    Weight 150 lb 6.4 oz (68.2 kg)    Waist Circumference 34 inches    Hip Circumference 39.3 inches    Waist to Hip Ratio 0.87 %    BMI (Calculated) 26.95    Single Leg Stand 5.5 seconds          Post Biometrics - 06/11/24 1115        Post  Biometrics   Height 5' 2.64 (1.591 m)    Weight 148 lb 12.8 oz (67.5 kg)    Waist Circumference 34 inches    Hip Circumference 39 inches    Waist to Hip Ratio 0.87 %    BMI (Calculated) 26.66    Single Leg Stand 6.6 seconds          Nutrition:  Nutrition Therapy & Goals - 04/26/24 1114       Nutrition Therapy   Diet Cardiac, Low Na    Protein (specify units) 75    Fiber 25 grams    Whole Grain Foods 3 servings    Saturated Fats 15 max. grams    Fruits and Vegetables 5 servings/day    Sodium 2 grams      Personal Nutrition Goals  Nutrition Goal Eat 15-30gProtein and 30-60gCarbs at each meal.    Personal Goal #2 Read labels and reduce sodium intake to below 2300mg . Ideally 1500mg  per day.    Personal Goal #3 Reduce saturated fat, less than 12g per day. Replace bad fats for more heart healthy fats.    Comments Kelena reports she was eating eggs and bacon, ice cream and other food that were not heart healthy but has been working on making changes and feels she is doing much better. Spoke with her today about what makes foods heart healthy. She is quite knowledgeable of heart healthy eating. Provided her with a guideline limit of less than 12g saturated fat per day and less than 1500mg  Sodium per day. Reviewed her recall, she is eating 3 times per day sometimes has a snack. She has been doing better with eating more lean meats and veggies. She has several veggies growing in her garden. She uses whole grain bread. Reviewed mediterranean diet handout. Educated on types of fats,  sources, and how to read labels. Reviewed several labels together and brainstormed meals and snacks with foods she likes and will eat.      Intervention Plan   Intervention Prescribe, educate and counsel regarding individualized specific dietary modifications aiming towards targeted core components such as weight, hypertension, lipid management, diabetes, heart failure and other comorbidities.;Nutrition handout(s) given to patient.    Expected Outcomes Short Term Goal: Understand basic principles of dietary content, such as calories, fat, sodium, cholesterol and nutrients.;Short Term Goal: A plan has been developed with personal nutrition goals set during dietitian appointment.;Long Term Goal: Adherence to prescribed nutrition plan.

## 2024-06-11 NOTE — Progress Notes (Signed)
 Daily Session Note  Patient Details  Name: Sheila Marquez MRN: [id number] Date of Birth: 1951/11/21 Referring Provider:   Flowsheet Row Cardiac Rehab from 04/08/2024 in [facility] Cardiac and Pulmonary Re[facility]  Referring Provider Belva Boyden, MD    Encounter Date: 06/11/2024  Check In:  Session Check In - 06/11/24 1127       Check-In   Supervising physician immediately available to respond to emergencies See telemetry face sheet for immediately available ER MD    Location [facility]-Cardiac & Pulmonary Rehab    Staff Present Maud Sorenson, RN, BSN, CCRP;Joseph Hood RCP,RRT,BSRT;Maxon Meredosia BS, Exercise Physiologist;Noah Tickle, BS, Exercise Physiologist    Virtual Visit No    Medication changes reported     No    Fall or balance concerns reported    No    Warm-up and Cool-down Performed on first and last piece of equipment    Resistance Training Performed Yes    VAD Patient? No    PAD/SET Patient? No      Pain Assessment   Currently in Pain? No/denies           6 Minute Walk     Row Name 04/08/24 1333 06/11/24 1114       6 Minute Walk   Phase Initial Discharge    Distance 1470 feet 1705 feet    Distance % Change -- 15.98 %    Distance Feet Change -- 235 ft    Walk Time 6 minutes 6 minutes    # of Rest Breaks 0 0    MPH 2.78 3.23    METS 3.48 3.73    RPE 13 11    Perceived Dyspnea  1 0    VO2 Peak 12.17 13.07    Symptoms No No    Resting HR 71 bpm 76 bpm    Resting BP 146/72 124/62    Resting Oxygen Saturation  99 % 96 %    Exercise Oxygen Saturation  during 6 min walk 100 % 94 %    Max Ex. HR 117 bpm 116 bpm    Max Ex. BP 170/70 152/72    2 Minute Post BP 128/70 --          Social History   Tobacco Use  Smoking Status Never  Smokeless Tobacco Never    Goals Met:  Independence with exercise equipment Exercise tolerated well No report of concerns or symptoms today  Goals Unmet:  Not Applicable  Comments: Pt able to follow exercise prescription  today without complaint.  Will continue to monitor for progression.    Dr. Firman Hughes is Medical Director for [facility] Cardiac Rehabilitation.  Dr. Fuad Aleskerov is Medical Director for [facility] Pulmonary Rehabilitation.

## 2024-06-14 ENCOUNTER — Encounter: Admitting: *Deleted

## 2024-06-14 DIAGNOSIS — Z48812 Encounter for surgical aftercare following surgery on the circulatory system: Secondary | ICD-10-CM | POA: Diagnosis not present

## 2024-06-14 DIAGNOSIS — Z955 Presence of coronary angioplasty implant and graft: Secondary | ICD-10-CM

## 2024-06-14 DIAGNOSIS — I214 Non-ST elevation (NSTEMI) myocardial infarction: Secondary | ICD-10-CM

## 2024-06-14 NOTE — Progress Notes (Signed)
 Daily Session Note  Patient Details  Name: Sheila Marquez MRN: [id number] Date of Birth: Jan 13, 1951 Referring Provider:   Flowsheet Row Cardiac Rehab from 04/08/2024 in [facility] Cardiac and Pulmonary Rehab  Referring Provider Perla Lye, MD    Encounter Date: 06/14/2024  Check In:  Session Check In - 06/14/24 1119       Check-In   Supervising physician immediately available to respond to emergencies See telemetry face sheet for immediately available ER MD    Location [facility]-Cardiac & Pulmonary Rehab    Staff Present Othel Durand, RN, BSN, CCRP;Meredith Tressa RN,BSN;[place] BS, ACSM CEP, Exercise Physiologist;Kelly Bollinger [facility]    Virtual Visit No    Medication changes reported     No    Fall or balance concerns reported    No    Warm-up and Cool-down Performed on first and last piece of equipment    Resistance Training Performed Yes    VAD Patient? No    PAD/SET Patient? No      Pain Assessment   Currently in Pain? No/denies             Social History   Tobacco Use  Smoking Status Never  Smokeless Tobacco Never    Goals Met:  Independence with exercise equipment Exercise tolerated well No report of concerns or symptoms today  Goals Unmet:  Not Applicable  Comments: Pt able to follow exercise prescription today without complaint.  Will continue to monitor for progression.    Dr. Oneil Pinal is Medical Director for [facility] Cardiac Rehabilitation.  Dr. Fuad Aleskerov is Medical Director for [facility] Pulmonary Rehabilitation.

## 2024-06-16 ENCOUNTER — Encounter

## 2024-06-16 DIAGNOSIS — Z955 Presence of coronary angioplasty implant and graft: Secondary | ICD-10-CM

## 2024-06-16 DIAGNOSIS — Z48812 Encounter for surgical aftercare following surgery on the circulatory system: Secondary | ICD-10-CM | POA: Diagnosis not present

## 2024-06-16 DIAGNOSIS — I214 Non-ST elevation (NSTEMI) myocardial infarction: Secondary | ICD-10-CM

## 2024-06-16 NOTE — Progress Notes (Signed)
 Daily Session Note  Patient Details  Name: RANETTA ARMACOST MRN: [id number] Date of Birth: [birth year] Referring Provider:   Flowsheet Row Cardiac Rehab from 04/08/2024 in [facility] Cardiac and Pulmonary Rehab  Referring Provider Perla Lye, MD    Encounter Date: 06/16/2024  Check In:  Session Check In - 06/16/24 1052       Check-In   Supervising physician immediately available to respond to emergencies See telemetry face sheet for immediately available ER MD    Location [facility]-Cardiac & Pulmonary Rehab    Staff Present Rollene Paterson, MS, Exercise Physiologist;Jason Elnor RDN,LDN;Raissa Dam RN,BSN,MPA;Joseph Rolinda RCP,RRT,BSRT    Virtual Visit No    Medication changes reported     No    Fall or balance concerns reported    No    Warm-up and Cool-down Performed on first and last piece of equipment    Resistance Training Performed Yes    VAD Patient? No    PAD/SET Patient? No      Pain Assessment   Currently in Pain? No/denies             Social History   Tobacco Use  Smoking Status Never  Smokeless Tobacco Never    Goals Met:  Independence with exercise equipment Exercise tolerated well No report of concerns or symptoms today Strength training completed today  Goals Unmet:  Not Applicable  Comments: Pt able to follow exercise prescription today without complaint.  Will continue to monitor for progression.    Dr. Oneil Pinal is Medical Director for [facility] Cardiac Rehabilitation.  Dr. Fuad Aleskerov is Medical Director for [facility] Pulmonary Rehabilitation.

## 2024-06-18 ENCOUNTER — Encounter

## 2024-06-18 DIAGNOSIS — I214 Non-ST elevation (NSTEMI) myocardial infarction: Secondary | ICD-10-CM

## 2024-06-18 DIAGNOSIS — Z48812 Encounter for surgical aftercare following surgery on the circulatory system: Secondary | ICD-10-CM | POA: Diagnosis not present

## 2024-06-18 DIAGNOSIS — Z955 Presence of coronary angioplasty implant and graft: Secondary | ICD-10-CM

## 2024-06-18 NOTE — Progress Notes (Signed)
 Cardiac Individual Treatment Plan  Patient Details  Name: Sheila Marquez MRN: [id number] Date of Birth: [birth year] Referring Provider:   Flowsheet Row Cardiac Rehab from 04/08/2024 in [facility] Cardiac and Pulmonary Rehab  Referring Provider Perla Lye, MD    Initial Encounter Date:  Flowsheet Row Cardiac Rehab from 04/08/2024 in [facility] Cardiac and Pulmonary Rehab  Date 04/08/24    Visit Diagnosis: NSTEMI (non-ST elevation myocardial infarction) [facility])  Status post coronary artery stent placement  Patient's Home Medications on Admission:  Current Outpatient Medications:    acetaminophen  (TYLENOL ) 500 MG tablet, Take 500 mg by mouth every 6 (six) hours as needed for mild pain (pain score 1-3) or fever., Disp: , Rfl:    aspirin  81 MG chewable tablet, Chew 1 tablet (81 mg total) by mouth daily., Disp: 30 tablet, Rfl: 12   calcium carbonate (OSCAL) 1500 (600 Ca) MG TABS tablet, Take 600 mg of elemental calcium by mouth 2 (two) times daily with a meal., Disp: , Rfl:    cetirizine (ZYRTEC) 10 MG tablet, Take 10 mg by mouth daily., Disp: , Rfl:    diphenhydrAMINE (BENADRYL) 25 MG tablet, Take 25 mg by mouth every 6 (six) hours as needed., Disp: , Rfl:    docusate sodium (COLACE) 50 MG capsule, Take 50 mg by mouth 2 (two) times daily as needed for mild constipation., Disp: , Rfl:    Evolocumab  (REPATHA  SURECLICK) 140 MG/ML SOAJ, Inject 140 mg into the skin every 14 (fourteen) days., Disp: 6 mL, Rfl: 3   ezetimibe  (ZETIA ) 10 MG tablet, Take 1 tablet by mouth daily., Disp: , Rfl:    glucosamine-chondroitin 500-400 MG tablet, Take 1 tablet by mouth 2 (two) times daily., Disp: , Rfl:    losartan  (COZAAR ) 25 MG tablet, Take 1 tablet (25 mg total) by mouth daily., Disp: 100 tablet, Rfl: 3   magnesium oxide (MAG-OX) 400 MG tablet, Take 400 mg by mouth daily., Disp: , Rfl:    metoprolol  succinate (TOPROL -XL) 25 MG 24 hr tablet, Take 0.5 tablets (12.5 mg total) by mouth daily., Disp: 45 tablet, Rfl: 3    Multiple Vitamin (MULTI-VITAMIN) tablet, Take 1 tablet by mouth daily., Disp: , Rfl:    psyllium (METAMUCIL) 58.6 % packet, Take 1 packet by mouth 2 (two) times daily., Disp: , Rfl:    spironolactone  (ALDACTONE ) 25 MG tablet, Take 0.5 tablets (12.5 mg total) by mouth daily., Disp: 15 tablet, Rfl: 5   ticagrelor  (BRILINTA ) 90 MG TABS tablet, Take 1 tablet (90 mg total) by mouth 2 (two) times daily., Disp: 60 tablet, Rfl: 5   zinc gluconate 50 MG tablet, Take 50 mg by mouth daily., Disp: , Rfl:   Past Medical History: Past Medical History:  Diagnosis Date   Eczema    Heart murmur    Hyperlipidemia    Hypertension    Plantar fasciitis     Tobacco Use: Social History   Tobacco Use  Smoking Status Never  Smokeless Tobacco Never    Labs: Review Flowsheet       Latest Ref Rng & Units 01/02/2024  Labs for ITP Cardiac and Pulmonary Rehab  Cholestrol 0 - 200 mg/dL 836   LDL (calc) 0 - 99 mg/dL 73   HDL-C >59 mg/dL 80   Trlycerides <849 mg/dL 51      Exercise Target Goals: Exercise Program Goal: Individual exercise prescription set using results from initial 6 min walk test and THRR while considering  patient's activity barriers and safety.   Exercise  Prescription Goal: Initial exercise prescription builds to 30-45 minutes a day of aerobic activity, 2-3 days per week.  Home exercise guidelines will be given to patient during program as part of exercise prescription that the participant will acknowledge.   Education: Aerobic Exercise: - Group verbal and visual presentation on the components of exercise prescription. Introduces F.I.T.T principle from ACSM for exercise prescriptions.  Reviews F.I.T.T. principles of aerobic exercise including progression. Written material given at graduation.   Education: Resistance Exercise: - Group verbal and visual presentation on the components of exercise prescription. Introduces F.I.T.T principle from ACSM for exercise prescriptions  Reviews  F.I.T.T. principles of resistance exercise including progression. Written material given at graduation.    Education: Exercise & Equipment Safety: - Individual verbal instruction and demonstration of equipment use and safety with use of the equipment. Flowsheet Row Cardiac Rehab from 06/16/2024 in [facility] Cardiac and Pulmonary Rehab  Date 04/08/24  Educator MB  Instruction Review Code 1- Verbalizes Understanding    Education: Exercise Physiology & General Exercise Guidelines: - Group verbal and written instruction with models to review the exercise physiology of the cardiovascular system and associated critical values. Provides general exercise guidelines with specific guidelines to those with heart or lung disease.  Flowsheet Row Cardiac Rehab from 06/16/2024 in [facility] Cardiac and Pulmonary Rehab  Date 05/05/24  Educator NT  Instruction Review Code 1- [employer] Understanding    Education: Flexibility, Balance, Mind/Body Relaxation: - Group verbal and visual presentation with interactive activity on the components of exercise prescription. Introduces F.I.T.T principle from ACSM for exercise prescriptions. Reviews F.I.T.T. principles of flexibility and balance exercise training including progression. Also discusses the mind body connection.  Reviews various relaxation techniques to help reduce and manage stress (i.e. Deep breathing, progressive muscle relaxation, and visualization). Balance handout provided to take home. Written material given at graduation.   Activity Barriers & Risk Stratification:  Activity Barriers & Cardiac Risk Stratification - 04/08/24 1334       Activity Barriers & Cardiac Risk Stratification   Activity Barriers Joint Problems;Arthritis   bad knees (full of arthritis)   Cardiac Risk Stratification Moderate          6 Minute Walk:  6 Minute Walk     Row Name 04/08/24 1333 06/11/24 1114       6 Minute Walk   Phase Initial Discharge    Distance 1470 feet 1705  feet    Distance % Change -- 15.98 %    Distance Feet Change -- 235 ft    Walk Time 6 minutes 6 minutes    # of Rest Breaks 0 0    MPH 2.78 3.23    METS 3.48 3.73    RPE 13 11    Perceived Dyspnea  1 0    VO2 Peak 12.17 13.07    Symptoms No No    Resting HR 71 bpm 76 bpm    Resting BP 146/72 124/62    Resting Oxygen Saturation  99 % 96 %    Exercise Oxygen Saturation  during 6 min walk 100 % 94 %    Max Ex. HR 117 bpm 116 bpm    Max Ex. BP 170/70 152/72    2 Minute Post BP 128/70 --       Oxygen Initial Assessment:   Oxygen Re-Evaluation:   Oxygen Discharge (Final Oxygen Re-Evaluation):   Initial Exercise Prescription:  Initial Exercise Prescription - 04/08/24 1300       Date of Initial Exercise RX  and Referring Provider   Date 04/08/24    Referring Provider Perla Lye, MD      Oxygen   Maintain Oxygen Saturation 88% or higher      Treadmill   MPH 2.7    Grade 0    Minutes 15    METs 3.07      Recumbant Bike   Level 3    RPM 50    Watts 32    Minutes 15    METs 3.48      NuStep   Level 3    SPM 80    Minutes 15    METs 3.48      REL-XR   Level 2    Speed 50    Minutes 15    METs 3.48      T5 Nustep   Level 3    SPM 80    Minutes 15    METs 3.48      Track   Laps 39    Minutes 15    METs 3.12      Prescription Details   Frequency (times per week) 3    Duration Progress to 30 minutes of continuous aerobic without signs/symptoms of physical distress      Intensity   THRR 40-80% of Max Heartrate 101-131    Ratings of Perceived Exertion 11-13    Perceived Dyspnea 0-4      Progression   Progression Continue to progress workloads to maintain intensity without signs/symptoms of physical distress.      Resistance Training   Training Prescription Yes    Weight 4 lb    Reps 10-15          Perform Capillary Blood Glucose checks as needed.  Exercise Prescription Changes:   Exercise Prescription Changes     Row Name  04/08/24 1300 04/27/24 1400 05/10/24 1400 05/24/24 1600 05/28/24 1100     Response to Exercise   Blood Pressure (Admit) 146/72 122/70 112/60 122/60 --   Blood Pressure (Exercise) 170/70 162/78 160/70 -- --   Blood Pressure (Exit) 128/70 102/60 112/62 124/60 --   Heart Rate (Admit) 71 bpm 88 bpm 77 bpm 67 bpm --   Heart Rate (Exercise) 117 bpm 125 bpm 122 bpm 131 bpm --   Heart Rate (Exit) 74 bpm 85 bpm 86 bpm 92 bpm --   Oxygen Saturation (Admit) 99 % -- -- -- --   Oxygen Saturation (Exercise) 100 % -- -- -- --   Oxygen Saturation (Exit) 100 % -- -- -- --   Rating of Perceived Exertion (Exercise) 13 14 13 13  --   Perceived Dyspnea (Exercise) 1 0 0 -- --   Symptoms none none none none none   Comments results first 2 weeks of exercise -- -- --   Duration Progress to 30 minutes of  aerobic without signs/symptoms of physical distress Progress to 30 minutes of  aerobic without signs/symptoms of physical distress Progress to 30 minutes of  aerobic without signs/symptoms of physical distress Continue with 30 min of aerobic exercise without signs/symptoms of physical distress. Continue with 30 min of aerobic exercise without signs/symptoms of physical distress.   Intensity THRR New THRR unchanged THRR unchanged THRR unchanged THRR unchanged     Progression   Progression Continue to progress workloads to maintain intensity without signs/symptoms of physical distress. Continue to progress workloads to maintain intensity without signs/symptoms of physical distress. Continue to progress workloads to maintain intensity without signs/symptoms of physical distress.  Continue to progress workloads to maintain intensity without signs/symptoms of physical distress. Continue to progress workloads to maintain intensity without signs/symptoms of physical distress.   Average METs 3.48 2.75 3.06 4.1 4.1     Resistance Training   Training Prescription -- Yes Yes Yes Yes   Weight -- 4 4 4  lb 4 lb   Reps --  10-15 10-15 10-15 10-15     Interval Training   Interval Training -- No No No No     Treadmill   MPH -- 2.8 3 3 3    Grade -- 0 0 6.5 6.5   Minutes -- 15 15 15 15    METs -- 3.14 3.3 5.99 5.99     NuStep   Level -- 3 5 4 4    Minutes -- 15 15 15 15    METs -- 3 4 3 3      REL-XR   Level -- -- -- 2 2   Minutes -- -- -- 15 15   METs -- -- -- 5.3 5.3     T5 Nustep   Level -- 3 3 3 3    Minutes -- 15 15 15 15    METs -- 2.4 2.4 2.6 2.6     Home Exercise Plan   Plans to continue exercise at -- -- -- -- Home (comment)  walk   Frequency -- -- -- -- Add 2 additional days to program exercise sessions.   Initial Home Exercises Provided -- -- -- -- 05/28/24     Oxygen   Maintain Oxygen Saturation -- 88% or higher 88% or higher 88% or higher 88% or higher    Row Name 06/08/24 1400             Response to Exercise   Blood Pressure (Admit) 124/68       Blood Pressure (Exercise) 140/70       Blood Pressure (Exit) 108/56       Heart Rate (Admit) 84 bpm       Heart Rate (Exercise) 111 bpm       Heart Rate (Exit) 75 bpm       Oxygen Saturation (Admit) 95 %       Oxygen Saturation (Exercise) 91 %       Oxygen Saturation (Exit) 94 %       Rating of Perceived Exertion (Exercise) 16       Perceived Dyspnea (Exercise) 0       Symptoms none       Duration Continue with 30 min of aerobic exercise without signs/symptoms of physical distress.       Intensity THRR unchanged         Progression   Progression Continue to progress workloads to maintain intensity without signs/symptoms of physical distress.       Average METs 3.38         Resistance Training   Training Prescription Yes       Weight 4 lb       Reps 10-15         Interval Training   Interval Training No         Treadmill   MPH 2.9       Grade 1.5       Minutes 15       METs 3.82         Recumbant Bike   Level 4       Watts 32       Minutes 15  METs 3.49         NuStep   Level 5       Minutes 15        METs 3.8         T5 Nustep   Level 3       Minutes 15       METs 2.7         Biostep-RELP   Level 3       SPM 50       Minutes 15       METs 3         Track   Laps 60  Hallway       Minutes 15       METs 3.61         Home Exercise Plan   Plans to continue exercise at Home (comment)  walk       Frequency Add 2 additional days to program exercise sessions.       Initial Home Exercises Provided 05/28/24         Oxygen   Maintain Oxygen Saturation 88% or higher          Exercise Comments:   Exercise Comments     Row Name 04/14/24 1136           Exercise Comments First full day of exercise!  Patient was oriented to gym and equipment including functions, settings, policies, and procedures.  Patient's individual exercise prescription and treatment plan were reviewed.  All starting workloads were established based on the results of the 6 minute walk test done at initial orientation visit.  The plan for exercise progression was also introduced and progression will be customized based on patient's performance and goals.          Exercise Goals and Review:   Exercise Goals     Row Name 04/08/24 1338             Exercise Goals   Increase Physical Activity Yes       Intervention Provide advice, education, support and counseling about physical activity/exercise needs.;Develop an individualized exercise prescription for aerobic and resistive training based on initial evaluation findings, risk stratification, comorbidities and participant's personal goals.       Expected Outcomes Short Term: Attend rehab on a regular basis to increase amount of physical activity.;Long Term: Add in home exercise to make exercise part of routine and to increase amount of physical activity.;Long Term: Exercising regularly at least 3-5 days a week.       Increase Strength and Stamina Yes       Intervention Provide advice, education, support and counseling about physical activity/exercise  needs.;Develop an individualized exercise prescription for aerobic and resistive training based on initial evaluation findings, risk stratification, comorbidities and participant's personal goals.       Expected Outcomes Short Term: Increase workloads from initial exercise prescription for resistance, speed, and METs.;Short Term: Perform resistance training exercises routinely during rehab and add in resistance training at home;Long Term: Improve cardiorespiratory fitness, muscular endurance and strength as measured by increased METs and functional capacity ( )       Able to understand and use rate of perceived exertion (RPE) scale Yes       Intervention Provide education and explanation on how to use RPE scale       Expected Outcomes Short Term: Able to use RPE daily in rehab to express subjective intensity level;Long Term:  Able to use RPE to guide  intensity level when exercising independently       Able to understand and use Dyspnea scale Yes       Intervention Provide education and explanation on how to use Dyspnea scale       Expected Outcomes Short Term: Able to use Dyspnea scale daily in rehab to express subjective sense of shortness of breath during exertion;Long Term: Able to use Dyspnea scale to guide intensity level when exercising independently       Knowledge and understanding of Target Heart Rate Range (THRR) Yes       Intervention Provide education and explanation of THRR including how the numbers were predicted and where they are located for reference       Expected Outcomes Short Term: Able to state/look up THRR;Short Term: Able to use daily as guideline for intensity in rehab;Long Term: Able to use THRR to govern intensity when exercising independently       Able to check pulse independently Yes       Intervention Provide education and demonstration on how to check pulse in carotid and radial arteries.;Review the importance of being able to check your own pulse for safety during  independent exercise       Expected Outcomes Short Term: Able to explain why pulse checking is important during independent exercise;Long Term: Able to check pulse independently and accurately       Understanding of Exercise Prescription Yes       Intervention Provide education, explanation, and written materials on patient's individual exercise prescription       Expected Outcomes Short Term: Able to explain program exercise prescription;Long Term: Able to explain home exercise prescription to exercise independently          Exercise Goals Re-Evaluation :  Exercise Goals Re-Evaluation     Row Name 04/14/24 1135 04/27/24 1440 05/10/24 1439 05/24/24 1620 05/28/24 1140     Exercise Goal Re-Evaluation   Exercise Goals Review Increase Strength and Stamina;Able to understand and use rate of perceived exertion (RPE) scale;Able to understand and use Dyspnea scale;Understanding of Exercise Prescription;Knowledge and understanding of Target Heart Rate Range (THRR) Increase Physical Activity;Increase Strength and Stamina;Understanding of Exercise Prescription Increase Physical Activity;Increase Strength and Stamina;Understanding of Exercise Prescription Increase Physical Activity;Increase Strength and Stamina;Understanding of Exercise Prescription Increase Physical Activity;Able to understand and use rate of perceived exertion (RPE) scale;Knowledge and understanding of Target Heart Rate Range (THRR);Understanding of Exercise Prescription;Increase Strength and Stamina;Able to understand and use Dyspnea scale;Able to check pulse independently   Comments Reviewed RPE and dyspnea scale, THR and program prescription with pt today.  Pt voiced understanding and was given a copy of goals to take home. Sheila Marquez is off to a great start in the program. She was able to attend her first few sessions during this weeks review period. During her sessions she was able to increase from 2.7 to 2. with no incline. We will  continue to monitor her progress in the program. Sheila Marquez is doing well in rehab. She was able to increase her speed on the treadmill from 2.8 to 3 mph. She was also able to increase from level 3 to 5 on the T4 nustep. We will continue to monitor his progress in the program. Sheila Marquez continues to do well in rehab. She increased her treadmill workload by adding incline at 6.5% and maintaining a speed of 3 mph. She also continues to work at level 4 on the T4 nustep, level 3 on the T5 nustep, and level 2 on  the XR. We will continue to monitor her progress in the program. Reviewed home exercise with pt today.  Pt plans to walk  for exercise.  Reviewed THR, pulse, RPE, sign and symptoms, pulse oximetery and when to call 911 or MD.  Also discussed weather considerations and indoor options.  Pt voiced understanding.   Expected Outcomes Short: Use RPE daily to regulate intensity. Long: Follow program prescription in THR. Short: Continue to follow exercise prescription. Long: Continue exercise to improve strength and stamina. Short: Continue to follow exercise prescription. Long: Continue exercise to improve strength and stamina. Short: Progressively increase workloads on seated machines. Long: Continue exercise to improve strength and stamina. Short: add 1-2 days a week of exercise at home on off days of cardiac rehab. Long: maintain independent exercise routine upon graduation from cardiac rehab.    Row Name 06/08/24 1417             Exercise Goal Re-Evaluation   Exercise Goals Review Increase Physical Activity;Understanding of Exercise Prescription;Increase Strength and Stamina       Comments Sheila Marquez continues to do well in rehab. She is due for her soon and hopes to improve. She was able to increase to 60 laps on the hallway track. She also increased to level 4 on the recumbent bike and level 5 on the T4 nustep. She maintained level 3 on the T5 nustep and level 3 on the biostep. We will continue to monitor her  progress in the program.       Expected Outcomes Short: Improve on post . Long: Continue to increase overall METs and stamina.          Discharge Exercise Prescription (Final Exercise Prescription Changes):  Exercise Prescription Changes - 06/08/24 1400       Response to Exercise   Blood Pressure (Admit) 124/68    Blood Pressure (Exercise) 140/70    Blood Pressure (Exit) 108/56    Heart Rate (Admit) 84 bpm    Heart Rate (Exercise) 111 bpm    Heart Rate (Exit) 75 bpm    Oxygen Saturation (Admit) 95 %    Oxygen Saturation (Exercise) 91 %    Oxygen Saturation (Exit) 94 %    Rating of Perceived Exertion (Exercise) 16    Perceived Dyspnea (Exercise) 0    Symptoms none    Duration Continue with 30 min of aerobic exercise without signs/symptoms of physical distress.    Intensity THRR unchanged      Progression   Progression Continue to progress workloads to maintain intensity without signs/symptoms of physical distress.    Average METs 3.38      Resistance Training   Training Prescription Yes    Weight 4 lb    Reps 10-15      Interval Training   Interval Training No      Treadmill   MPH 2.9    Grade 1.5    Minutes 15    METs 3.82      Recumbant Bike   Level 4    Watts 32    Minutes 15    METs 3.49      NuStep   Level 5    Minutes 15    METs 3.8      T5 Nustep   Level 3    Minutes 15    METs 2.7      Biostep-RELP   Level 3    SPM 50    Minutes 15    METs 3  Track   Laps 60   Hallway   Minutes 15    METs 3.61      Home Exercise Plan   Plans to continue exercise at Home (comment)   walk   Frequency Add 2 additional days to program exercise sessions.    Initial Home Exercises Provided 05/28/24      Oxygen   Maintain Oxygen Saturation 88% or higher          Nutrition:  Target Goals: Understanding of nutrition guidelines, daily intake of sodium 1500mg , cholesterol 200mg , calories 30% from fat and 7% or less from saturated fats, daily to  have 5 or more servings of fruits and vegetables.  Education: All About Nutrition: -Group instruction provided by verbal, written material, interactive activities, discussions, models, and posters to present general guidelines for heart healthy nutrition including fat, fiber, MyPlate, the role of sodium in heart healthy nutrition, utilization of the nutrition label, and utilization of this knowledge for meal planning. Follow up email sent as well. Written material given at graduation. Flowsheet Row Cardiac Rehab from 06/16/2024 in [facility] Cardiac and Pulmonary Rehab  Date 06/02/24  Educator jg part 2  Instruction Review Code 1- Verbalizes Understanding    Biometrics:  Pre Biometrics - 04/08/24 1338       Pre Biometrics   Height 5' 2.64 (1.591 m)    Weight 150 lb 6.4 oz (68.2 kg)    Waist Circumference 34 inches    Hip Circumference 39.3 inches    Waist to Hip Ratio 0.87 %    BMI (Calculated) 26.95    Single Leg Stand 5.5 seconds          Post Biometrics - 06/11/24 1115        Post  Biometrics   Height 5' 2.64 (1.591 m)    Weight 148 lb 12.8 oz (67.5 kg)    Waist Circumference 34 inches    Hip Circumference 39 inches    Waist to Hip Ratio 0.87 %    BMI (Calculated) 26.66    Single Leg Stand 6.6 seconds          Nutrition Therapy Plan and Nutrition Goals:  Nutrition Therapy & Goals - 04/26/24 1114       Nutrition Therapy   Diet Cardiac, Low Na    Protein (specify units) 75    Fiber 25 grams    Whole Grain Foods 3 servings    Saturated Fats 15 max. grams    Fruits and Vegetables 5 servings/day    Sodium 2 grams      Personal Nutrition Goals   Nutrition Goal Eat 15-30gProtein and 30-60gCarbs at each meal.    Personal Goal #2 Read labels and reduce sodium intake to below 2300mg . Ideally 1500mg  per day.    Personal Goal #3 Reduce saturated fat, less than 12g per day. Replace bad fats for more heart healthy fats.    Comments Sheila Marquez reports she was eating eggs and  bacon, ice cream and other food that were not heart healthy but has been working on making changes and feels she is doing much better. Spoke with her today about what makes foods heart healthy. She is quite knowledgeable of heart healthy eating. Provided her with a guideline limit of less than 12g saturated fat per day and less than 1500mg  Sodium per day. Reviewed her recall, she is eating 3 times per day sometimes has a snack. She has been doing better with eating more lean meats and veggies. She has  several veggies growing in her garden. She uses whole grain bread. Reviewed mediterranean diet handout. Educated on types of fats, sources, and how to read labels. Reviewed several labels together and brainstormed meals and snacks with foods she likes and will eat.      Intervention Plan   Intervention Prescribe, educate and counsel regarding individualized specific dietary modifications aiming towards targeted core components such as weight, hypertension, lipid management, diabetes, heart failure and other comorbidities.;Nutrition handout(s) given to patient.    Expected Outcomes Short Term Goal: Understand basic principles of dietary content, such as calories, fat, sodium, cholesterol and nutrients.;Short Term Goal: A plan has been developed with personal nutrition goals set during dietitian appointment.;Long Term Goal: Adherence to prescribed nutrition plan.          Nutrition Assessments:  MEDIFICTS Score Key: >=70 Need to make dietary changes  40-70 Heart Healthy Diet <= 40 Therapeutic Level Cholesterol Diet  Flowsheet Row Cardiac Rehab from 04/14/2024 in [facility] Cardiac and Pulmonary Rehab  Picture Your Plate Total Score on Admission 79   Picture Your Plate Scores: <59 Unhealthy dietary pattern with much room for improvement. 41-50 Dietary pattern unlikely to meet recommendations for good health and room for improvement. 51-60 More healthful dietary pattern, with some room for improvement.   >60 Healthy dietary pattern, although there may be some specific behaviors that could be improved.    Nutrition Goals Re-Evaluation:  Nutrition Goals Re-Evaluation     Row Name 05/05/24 1114             Goals   Nutrition Goal Eat 15-30gProtein and 30-60gCarbs at each meal.       Comment Sheila Marquez reports that she is reading food labels and is trying to reduce sodium intake. She uses a salt substitute, lemon juice, and other herbs and spices to flavor her food. She is trying to incorporate lean protein like fish and chicken into her diet, and only occationally eat beef. She reports that she is able to meet her nutritional needs and is trying to make heart healthy changes.       Expected Outcome Short: continue to read food labels and reduce sodium intake. Long: maintain heart healthty diet.         Personal Goal #2 Re-Evaluation   Personal Goal #2 Read labels and reduce sodium intake to below 2300mg . Ideally 1500mg  per day.         Personal Goal #3 Re-Evaluation   Personal Goal #3 Reduce saturated fat, less than 12g per day. Replace bad fats for more heart healthy fats.          Nutrition Goals Discharge (Final Nutrition Goals Re-Evaluation):  Nutrition Goals Re-Evaluation - 05/05/24 1114       Goals   Nutrition Goal Eat 15-30gProtein and 30-60gCarbs at each meal.    Comment Sheila Marquez reports that she is reading food labels and is trying to reduce sodium intake. She uses a salt substitute, lemon juice, and other herbs and spices to flavor her food. She is trying to incorporate lean protein like fish and chicken into her diet, and only occationally eat beef. She reports that she is able to meet her nutritional needs and is trying to make heart healthy changes.    Expected Outcome Short: continue to read food labels and reduce sodium intake. Long: maintain heart healthty diet.      Personal Goal #2 Re-Evaluation   Personal Goal #2 Read labels and reduce sodium intake to below 2300mg .  Ideally 1500mg   per day.      Personal Goal #3 Re-Evaluation   Personal Goal #3 Reduce saturated fat, less than 12g per day. Replace bad fats for more heart healthy fats.          Psychosocial: Target Goals: Acknowledge presence or absence of significant depression and/or stress, maximize coping skills, provide positive support system. Participant is able to verbalize types and ability to use techniques and skills needed for reducing stress and depression.   Education: Stress, Anxiety, and Depression - Group verbal and visual presentation to define topics covered.  Reviews how body is impacted by stress, anxiety, and depression.  Also discusses healthy ways to reduce stress and to treat/manage anxiety and depression.  Written material given at graduation. Flowsheet Row Cardiac Rehab from 06/16/2024 in [facility] Cardiac and Pulmonary Rehab  Date 04/28/24  Educator [facility]  Instruction Review Code 1- [employer] Understanding    Education: Sleep Hygiene -Provides group verbal and written instruction about how sleep can affect your health.  Define sleep hygiene, discuss sleep cycles and impact of sleep habits. Review good sleep hygiene tips.    Initial Review & Psychosocial Screening:  Initial Psych Review & Screening - 04/06/24 1357       Initial Review   Current issues with None Identified      Family Dynamics   Good Support System? Yes   2 daughters, friends     Barriers   Psychosocial barriers to participate in program There are no identifiable barriers or psychosocial needs.      Screening Interventions   Interventions Encouraged to exercise;To provide support and resources with identified psychosocial needs;Provide feedback about the scores to participant    Expected Outcomes Short Term goal: Utilizing psychosocial counselor, staff and physician to assist with identification of specific Stressors or current issues interfering with healing process. Setting desired goal for each stressor  or current issue identified.;Long Term Goal: Stressors or current issues are controlled or eliminated.;Short Term goal: Identification and review with participant of any Quality of Life or Depression concerns found by scoring the questionnaire.;Long Term goal: The participant improves quality of Life and PHQ9 Scores as seen by post scores and/or verbalization of changes          Quality of Life Scores:   Quality of Life - 04/14/24 1537       Quality of Life   Select Quality of Life      Quality of Life Scores   Health/Function Pre 26.3 %    Socioeconomic Pre 30 %    Psych/Spiritual Pre 30 %    Family Pre 28.5 %    GLOBAL Pre 28.24 %         Scores of 19 and below usually indicate a poorer quality of life in these areas.  A difference of  2-3 points is a clinically meaningful difference.  A difference of 2-3 points in the total score of the Quality of Life Index has been associated with significant improvement in overall quality of life, self-image, physical symptoms, and general health in studies assessing change in quality of life.  PHQ-9: Review Flowsheet       04/08/2024  Depression screen PHQ 2/9  Decreased Interest 0  Down, Depressed, Hopeless 0  PHQ - 2 Score 0  Altered sleeping 0  Tired, decreased energy 1  Change in appetite 0  Feeling bad or failure about yourself  0  Trouble concentrating 0  Moving slowly or fidgety/restless 0  Suicidal thoughts 0  PHQ-9  Score 1  Difficult doing work/chores Not difficult at all   Interpretation of Total Score  Total Score Depression Severity:  1-4 = Minimal depression, 5-9 = Mild depression, 10-14 = Moderate depression, 15-19 = Moderately severe depression, 20-27 = Severe depression   Psychosocial Evaluation and Intervention:  Psychosocial Evaluation - 04/06/24 1413       Psychosocial Evaluation & Interventions   Interventions Encouraged to exercise with the program and follow exercise prescription    Comments There are  no barriers to attending the program.  Her 2 daughters are her support. She is ready to get strted with the program.    Expected Outcomes STG attend all scheduled sessions, work on exercise progression while in the program  LTG continue with exercise progression and utilize resources found and taught at program    Continue Psychosocial Services  Follow up required by staff          Psychosocial Re-Evaluation:  Psychosocial Re-Evaluation     Row Name 05/05/24 1121 06/11/24 1127           Psychosocial Re-Evaluation   Current issues with None Identified None Identified      Comments Sheila Marquez reports that she does not currently have any stress, sleep, or mental health concerns. She reports that she has a good support system. She sleeps will at night and feels rested. Patient reports no issues with their current mental states, sleep, stress, depression or anxiety. Will follow up with patient in a few weeks for any changes.      Expected Outcomes Short: continue to attend cardiac rehab consistently for mental health benefits of exercise. Long: maintain good mental health routine and good supportive relationships. Short: Continue to exercise regularly to support mental health and notify staff of any changes. Long: maintain mental health and well being through teaching of rehab or prescribed medications independently.      Interventions Encouraged to attend Cardiac Rehabilitation for the exercise Encouraged to attend Cardiac Rehabilitation for the exercise      Continue Psychosocial Services  Follow up required by staff No Follow up required         Psychosocial Discharge (Final Psychosocial Re-Evaluation):  Psychosocial Re-Evaluation - 06/11/24 1127       Psychosocial Re-Evaluation   Current issues with None Identified    Comments Patient reports no issues with their current mental states, sleep, stress, depression or anxiety. Will follow up with patient in a few weeks for any changes.     Expected Outcomes Short: Continue to exercise regularly to support mental health and notify staff of any changes. Long: maintain mental health and well being through teaching of rehab or prescribed medications independently.    Interventions Encouraged to attend Cardiac Rehabilitation for the exercise    Continue Psychosocial Services  No Follow up required          Vocational Rehabilitation: Provide vocational rehab assistance to qualifying candidates.   Vocational Rehab Evaluation & Intervention:   Education: Education Goals: Education classes will be provided on a variety of topics geared toward better understanding of heart health and risk factor modification. Participant will state understanding/return demonstration of topics presented as noted by education test scores.  Learning Barriers/Preferences:   General Cardiac Education Topics:  AED/CPR: - Group verbal and written instruction with the use of models to demonstrate the basic use of the AED with the basic ABC's of resuscitation.   Anatomy and Cardiac Procedures: - Group verbal and visual presentation and models provide information  about basic cardiac anatomy and function. Reviews the testing methods done to diagnose heart disease and the outcomes of the test results. Describes the treatment choices: Medical Management, Angioplasty, or Coronary Bypass Surgery for treating various heart conditions including Myocardial Infarction, Angina, Valve Disease, and Cardiac Arrhythmias.  Written material given at graduation. Flowsheet Row Cardiac Rehab from 06/16/2024 in [facility] Cardiac and Pulmonary Rehab  Date 06/09/24  Educator [facility]  Instruction Review Code 1- Verbalizes Understanding    Medication Safety: - Group verbal and visual instruction to review commonly prescribed medications for heart and lung disease. Reviews the medication, class of the drug, and side effects. Includes the steps to properly store meds and maintain the  prescription regimen.  Written material given at graduation. Flowsheet Row Cardiac Rehab from 06/16/2024 in [facility] Cardiac and Pulmonary Rehab  Date 06/16/24  Educator sb  Instruction Review Code 1- Verbalizes Understanding    Intimacy: - Group verbal instruction through game format to discuss how heart and lung disease can affect sexual intimacy. Written material given at graduation..   Know Your Numbers and Heart Failure: - Group verbal and visual instruction to discuss disease risk factors for cardiac and pulmonary disease and treatment options.  Reviews associated critical values for Overweight/Obesity, Hypertension, Cholesterol, and Diabetes.  Discusses basics of heart failure: signs/symptoms and treatments.  Introduces Heart Failure Zone chart for action plan for heart failure.  Written material given at graduation. Flowsheet Row Cardiac Rehab from 06/16/2024 in [facility] Cardiac and Pulmonary Rehab  Date 04/14/24  Educator [facility]  Instruction Review Code 1- Verbalizes Understanding    Infection Prevention: - Provides verbal and written material to individual with discussion of infection control including proper hand washing and proper equipment cleaning during exercise session. Flowsheet Row Cardiac Rehab from 06/16/2024 in [facility] Cardiac and Pulmonary Rehab  Date 04/08/24  Educator MB  Instruction Review Code 1- Verbalizes Understanding    Falls Prevention: - Provides verbal and written material to individual with discussion of falls prevention and safety. Flowsheet Row Cardiac Rehab from 06/16/2024 in [facility] Cardiac and Pulmonary Rehab  Date 04/08/24  Educator MB  Instruction Review Code 1- Verbalizes Understanding    Other: -Provides group and verbal instruction on various topics (see comments)   Knowledge Questionnaire Score:  Knowledge Questionnaire Score - 04/14/24 1540       Knowledge Questionnaire Score   Pre Score 26          Core Components/Risk Factors/Patient Goals  at Admission:  Personal Goals and Risk Factors at Admission - 04/08/24 1339       Core Components/Risk Factors/Patient Goals on Admission    Weight Management Yes;Weight Loss;Weight Maintenance    Intervention Weight Management: Develop a combined nutrition and exercise program designed to reach desired caloric intake, while maintaining appropriate intake of nutrient and fiber, sodium and fats, and appropriate energy expenditure required for the weight goal.;Weight Management: Provide education and appropriate resources to help participant work on and attain dietary goals.;Weight Management/Obesity: Establish reasonable short term and long term weight goals.    Admit Weight 150 lb 6.4 oz (68.2 kg)    Goal Weight: Short Term 142 lb 14.4 oz (64.8 kg)    Goal Weight: Long Term 135 lb (61.2 kg)    Expected Outcomes Short Term: Continue to assess and modify interventions until short term weight is achieved;Long Term: Adherence to nutrition and physical activity/exercise program aimed toward attainment of established weight goal;Weight Maintenance: Understanding of the daily nutrition guidelines, which includes 25-35% calories  from fat, 7% or less cal from saturated fats, less than 200mg  cholesterol, less than 1.5gm of sodium, & 5 or more servings of fruits and vegetables daily;Weight Loss: Understanding of general recommendations for a balanced deficit meal plan, which promotes 1-2 lb weight loss per week and includes a negative energy balance of [phone] kcal/d;Understanding recommendations for meals to include 15-35% energy as protein, 25-35% energy from fat, 35-60% energy from carbohydrates, less than 200mg  of dietary cholesterol, 20-35 gm of total fiber daily;Understanding of distribution of calorie intake throughout the day with the consumption of 4-5 meals/snacks    Hypertension Yes    Intervention Provide education on lifestyle modifcations including regular physical activity/exercise, weight  management, moderate sodium restriction and increased consumption of fresh fruit, vegetables, and low fat dairy, alcohol moderation, and smoking cessation.;Monitor prescription use compliance.    Expected Outcomes Short Term: Continued assessment and intervention until BP is < 140/63mm HG in hypertensive participants. < 130/49mm HG in hypertensive participants with diabetes, heart failure or chronic kidney disease.;Long Term: Maintenance of blood pressure at goal levels.    Lipids Yes    Intervention Provide education and support for participant on nutrition & aerobic/resistive exercise along with prescribed medications to achieve LDL 70mg , HDL >40mg .    Expected Outcomes Short Term: Participant states understanding of desired cholesterol values and is compliant with medications prescribed. Participant is following exercise prescription and nutrition guidelines.;Long Term: Cholesterol controlled with medications as prescribed, with individualized exercise RX and with personalized nutrition plan. Value goals: LDL < 70mg , HDL > 40 mg.          Education:Diabetes - Individual verbal and written instruction to review signs/symptoms of diabetes, desired ranges of glucose level fasting, after meals and with exercise. Acknowledge that pre and post exercise glucose checks will be done for 3 sessions at entry of program.   Core Components/Risk Factors/Patient Goals Review:   Goals and Risk Factor Review     Row Name 05/05/24 1117 06/11/24 1128           Core Components/Risk Factors/Patient Goals Review   Personal Goals Review Lipids;Hypertension Other      Review Sheila Marquez reports that she takes all her blood pressure and cholesterol medication as prescibed. She monitors her BP at home 3 times a day as recommended by her cardiologist. She keeps records of these readings and has an appointment with her cardiologist on 05/18/24 and will review these results with her. She does have some soreness related to  her cholesterol medication injection that she has to take every other week. She has communicated these concerns to her doctor and will work with them if changes are needed. She follows up with all doctor appointments and lab work required. Sheila Marquez has done well in theprogram and is going to continue to exercise at [facility]. She is going to walk and do the eliptical.      Expected Outcomes Short: continue to monitor BP at home and meet with cardiologist this month. Long: maintain good control over BP and cholesterol with the help of her medical team. Short: Graduate [facility] Long: maintain exercise independently.         Core Components/Risk Factors/Patient Goals at Discharge (Final Review):   Goals and Risk Factor Review - 06/11/24 1128       Core Components/Risk Factors/Patient Goals Review   Personal Goals Review Other    Review Sheila Marquez has done well in theprogram and is going to continue to exercise at [employer]. She is  going to walk and do the eliptical.    Expected Outcomes Short: Graduate [facility] Long: maintain exercise independently.          ITP Comments:  ITP Comments     Row Name 04/06/24 1412 04/08/24 1126 04/14/24 1136 04/28/24 1328 05/26/24 0957   ITP Comments Virtual orientation call completed today. shehas an appointment on Date: 04/08/2024  for EP eval and gym Orientation.  Documentation of diagnosis can be found in [facility] 12/31/2023 . Completed and gym orientation for cardiac rehab. Initial ITP created and sent for review to Dr. Oneil Pinal, Medical Director. First full day of exercise!  Patient was oriented to gym and equipment including functions, settings, policies, and procedures.  Patient's individual exercise prescription and treatment plan were reviewed.  All starting workloads were established based on the results of the 6 minute walk test done at initial orientation visit.  The plan for exercise progression was also introduced and progression will be customized  based on patient's performance and goals. 30 Day review completed. Medical Director ITP review done, changes made as directed, and signed approval by Medical Director.    new to program 30 Day review completed. Medical Director ITP review done, changes made as directed, and signed approval by Medical Director.    Row Name 06/18/24 1116           ITP Comments Sheila Marquez graduated today from  rehab with 36 sessions completed.  Details of the patient's exercise prescription and what [name] in order to continue the prescription and progress were discussed with patient.  Patient was given a copy of prescription and goals.  Patient verbalized understanding. Sheila Marquez plans to continue to exercise by going to [employer] and working out at home.          Comments: Discharge ITP

## 2024-06-18 NOTE — Progress Notes (Signed)
 Discharge Summary: Sheila Marquez DOB: 1951/04/15  [name] graduated today from  rehab with 36 sessions completed.  Details of the patient's exercise prescription and what [name] in order to continue the prescription and progress were discussed with patient.  Patient was given a copy of prescription and goals.  Patient verbalized understanding. [name] plans to continue to exercise by going to [employer] and working out at home.   6 Minute Walk     Row Name 04/08/24 1333 06/11/24 1114       6 Minute Walk   Phase Initial Discharge    Distance 1470 feet 1705 feet    Distance % Change -- 15.98 %    Distance Feet Change -- 235 ft    Walk Time 6 minutes 6 minutes    # of Rest Breaks 0 0    MPH 2.78 3.23    METS 3.48 3.73    RPE 13 11    Perceived Dyspnea  1 0    VO2 Peak 12.17 13.07    Symptoms No No    Resting HR 71 bpm 76 bpm    Resting BP 146/72 124/62    Resting Oxygen Saturation  99 % 96 %    Exercise Oxygen Saturation  during 6 min walk 100 % 94 %    Max Ex. HR 117 bpm 116 bpm    Max Ex. BP 170/70 152/72    2 Minute Post BP 128/70 --

## 2024-06-18 NOTE — Progress Notes (Signed)
 Daily Session Note  Patient Details  Name: Sheila Marquez MRN: [id number] Date of Birth: Feb 12, 1951 Referring Provider:   Flowsheet Row Cardiac Rehab from 04/08/2024 in [facility] Cardiac and Pulmonary Rehab  Referring Provider Perla Lye, MD    Encounter Date: 06/18/2024  Check In:  Session Check In - 06/18/24 1056       Check-In   Supervising physician immediately available to respond to emergencies See telemetry face sheet for immediately available ER MD    Location [facility]-Cardiac & Pulmonary Rehab    Staff Present Devaughn Jaeger, BS, Exercise Physiologist;Jakori Burkett RN,BSN,MPA;Maxon Conetta BS, Exercise Physiologist;Joseph Rolinda RCP,RRT,BSRT    Virtual Visit No    Medication changes reported     No    Fall or balance concerns reported    No    Warm-up and Cool-down Performed on first and last piece of equipment    Resistance Training Performed Yes    VAD Patient? No    PAD/SET Patient? No      Pain Assessment   Currently in Pain? No/denies             Social History   Tobacco Use  Smoking Status Never  Smokeless Tobacco Never    Goals Met:  Independence with exercise equipment Exercise tolerated well No report of concerns or symptoms today Strength training completed today  Goals Unmet:  Not Applicable  Comments:  Kathyjo graduated today from  rehab with 36 sessions completed.  Details of the patient's exercise prescription and what [name] in order to continue the prescription and progress were discussed with patient.  Patient was given a copy of prescription and goals.  Patient verbalized understanding. Amaiyah plans to continue to exercise by going to [employer] and working out at home.     Dr. Oneil Pinal is Medical Director for [facility] Cardiac Rehabilitation.  Dr. Fuad Aleskerov is Medical Director for [facility] Pulmonary Rehabilitation.

## 2024-06-21 ENCOUNTER — Telehealth: Payer: Self-pay | Admitting: Cardiovascular Disease

## 2024-06-21 ENCOUNTER — Other Ambulatory Visit (HOSPITAL_COMMUNITY): Payer: Self-pay | Admitting: Physician Assistant

## 2024-06-21 ENCOUNTER — Encounter

## 2024-06-21 MED ORDER — TICAGRELOR 90 MG PO TABS: 90.0000 mg | ORAL_TABLET | Freq: Two times a day (BID) | ORAL | 3 refills | Status: DC

## 2024-06-21 MED ORDER — SPIRONOLACTONE 25 MG PO TABS: 12.5000 mg | ORAL_TABLET | Freq: Every day | ORAL | 3 refills | Status: DC

## 2024-06-21 NOTE — Patient Instructions (Signed)
 Discharge Patient Instructions  Patient Details  Name: Sheila Marquez MRN: [id number] Date of Birth: [birth year] Referring Provider:  Sherial Bail, MD   Number of Visits: 71  Reason for Discharge:  Patient reached a stable level of exercise. Patient independent in their exercise. Patient has met program and personal goals.   Diagnosis:  NSTEMI (non-ST elevation myocardial infarction) [facility])  Status post coronary artery stent placement  Initial Exercise Prescription:  Initial Exercise Prescription - 04/08/24 1300       Date of Initial Exercise RX and Referring Provider   Date 04/08/24    Referring Provider Perla Lye, MD      Oxygen   Maintain Oxygen Saturation 88% or higher      Treadmill   MPH 2.7    Grade 0    Minutes 15    METs 3.07      Recumbant Bike   Level 3    RPM 50    Watts 32    Minutes 15    METs 3.48      NuStep   Level 3    SPM 80    Minutes 15    METs 3.48      REL-XR   Level 2    Speed 50    Minutes 15    METs 3.48      T5 Nustep   Level 3    SPM 80    Minutes 15    METs 3.48      Track   Laps 39    Minutes 15    METs 3.12      Prescription Details   Frequency (times per week) 3    Duration Progress to 30 minutes of continuous aerobic without signs/symptoms of physical distress      Intensity   THRR 40-80% of Max Heartrate 101-131    Ratings of Perceived Exertion 11-13    Perceived Dyspnea 0-4      Progression   Progression Continue to progress workloads to maintain intensity without signs/symptoms of physical distress.      Resistance Training   Training Prescription Yes    Weight 4 lb    Reps 10-15          Discharge Exercise Prescription (Final Exercise Prescription Changes):  Exercise Prescription Changes - 06/08/24 1400       Response to Exercise   Blood Pressure (Admit) 124/68    Blood Pressure (Exercise) 140/70    Blood Pressure (Exit) 108/56    Heart Rate (Admit) 84 bpm    Heart Rate  (Exercise) 111 bpm    Heart Rate (Exit) 75 bpm    Oxygen Saturation (Admit) 95 %    Oxygen Saturation (Exercise) 91 %    Oxygen Saturation (Exit) 94 %    Rating of Perceived Exertion (Exercise) 16    Perceived Dyspnea (Exercise) 0    Symptoms none    Duration Continue with 30 min of aerobic exercise without signs/symptoms of physical distress.    Intensity THRR unchanged      Progression   Progression Continue to progress workloads to maintain intensity without signs/symptoms of physical distress.    Average METs 3.38      Resistance Training   Training Prescription Yes    Weight 4 lb    Reps 10-15      Interval Training   Interval Training No      Treadmill   MPH 2.9    Grade 1.5    Minutes  15    METs 3.82      Recumbant Bike   Level 4    Watts 32    Minutes 15    METs 3.49      NuStep   Level 5    Minutes 15    METs 3.8      T5 Nustep   Level 3    Minutes 15    METs 2.7      Biostep-RELP   Level 3    SPM 50    Minutes 15    METs 3      Track   Laps 60   Hallway   Minutes 15    METs 3.61      Home Exercise Plan   Plans to continue exercise at Home (comment)   walk   Frequency Add 2 additional days to program exercise sessions.    Initial Home Exercises Provided 05/28/24      Oxygen   Maintain Oxygen Saturation 88% or higher          Functional Capacity:  6 Minute Walk     Row Name 04/08/24 1333 06/11/24 1114       6 Minute Walk   Phase Initial Discharge    Distance 1470 feet 1705 feet    Distance % Change -- 15.98 %    Distance Feet Change -- 235 ft    Walk Time 6 minutes 6 minutes    # of Rest Breaks 0 0    MPH 2.78 3.23    METS 3.48 3.73    RPE 13 11    Perceived Dyspnea  1 0    VO2 Peak 12.17 13.07    Symptoms No No    Resting HR 71 bpm 76 bpm    Resting BP 146/72 124/62    Resting Oxygen Saturation  99 % 96 %    Exercise Oxygen Saturation  during 6 min walk 100 % 94 %    Max Ex. HR 117 bpm 116 bpm    Max Ex. BP 170/70  152/72    2 Minute Post BP 128/70 --       Nutrition & Weight - Outcomes:  Pre Biometrics - 04/08/24 1338       Pre Biometrics   Height 5' 2.64 (1.591 m)    Weight 150 lb 6.4 oz (68.2 kg)    Waist Circumference 34 inches    Hip Circumference 39.3 inches    Waist to Hip Ratio 0.87 %    BMI (Calculated) 26.95    Single Leg Stand 5.5 seconds          Post Biometrics - 06/11/24 1115        Post  Biometrics   Height 5' 2.64 (1.591 m)    Weight 148 lb 12.8 oz (67.5 kg)    Waist Circumference 34 inches    Hip Circumference 39 inches    Waist to Hip Ratio 0.87 %    BMI (Calculated) 26.66    Single Leg Stand 6.6 seconds

## 2024-06-21 NOTE — Telephone Encounter (Signed)
 Pt's medication was sent to pt's pharmacy as requested. Confirmation received.

## 2024-06-21 NOTE — Telephone Encounter (Signed)
 Patient came by office to request a refill  *STAT* If patient is at the pharmacy, call can be transferred to refill team.   1. Which medications need to be refilled? (please list name of each medication and dose if known)  Brilinta  90 MG 1 tablet by mouth 2 times daily Spirolactone 25 MG 0.5 tablets by mouth daily    2. Would you like to learn more about the convenience, safety, & potential cost savings by using the [facility]? no     3. Are you open to using the [facility] (Type [facility]. no ).   4. Which pharmacy/location (including street and city if local pharmacy) is medication to be sent to?[facility] [place]   5. Do they need a 30 day or 90 day supply? 90 day

## 2024-06-23 ENCOUNTER — Encounter

## 2024-06-28 ENCOUNTER — Encounter

## 2024-06-30 ENCOUNTER — Encounter

## 2024-07-02 ENCOUNTER — Encounter

## 2024-07-05 ENCOUNTER — Encounter

## 2024-07-07 ENCOUNTER — Encounter

## 2024-07-13 ENCOUNTER — Ambulatory Visit
Admission: RE | Admit: 2024-07-13 | Discharge: 2024-07-13 | Disposition: A | Discharge status: Home / Self Care | Source: Ambulatory Visit | Attending: Internal Medicine | Admitting: Internal Medicine

## 2024-07-13 DIAGNOSIS — Z1231 Encounter for screening mammogram for malignant neoplasm of breast: Secondary | ICD-10-CM | POA: Diagnosis not present

## 2024-07-23 NOTE — Progress Notes (Signed)
 [facility] Quality Team Note  Name: Sheila Marquez Date of Birth: 08-21-1951 MRN: [id number] Date: 07/23/2024  [facility] Quality Team has reviewed this patient's chart, please see recommendations below:  [facility] Quality Other; (CHART REVIEWED FOR [facility]. ABSTRACTED ALL COMPONENTS FOR 01/02/2024 D/C)

## 2024-09-20 ENCOUNTER — Telehealth: Payer: Self-pay | Admitting: Pharmacy Technician

## 2024-09-20 ENCOUNTER — Other Ambulatory Visit: Payer: Self-pay

## 2024-09-20 ENCOUNTER — Other Ambulatory Visit (HOSPITAL_COMMUNITY): Payer: Self-pay

## 2024-09-20 ENCOUNTER — Telehealth: Payer: Self-pay | Admitting: Cardiovascular Disease

## 2024-09-20 ENCOUNTER — Other Ambulatory Visit: Payer: Self-pay | Admitting: Cardiovascular Disease

## 2024-09-20 DIAGNOSIS — Z79899 Other long term (current) drug therapy: Secondary | ICD-10-CM

## 2024-09-20 DIAGNOSIS — I214 Non-ST elevation (NSTEMI) myocardial infarction: Secondary | ICD-10-CM

## 2024-09-20 DIAGNOSIS — E782 Mixed hyperlipidemia: Secondary | ICD-10-CM

## 2024-09-20 DIAGNOSIS — I2510 Atherosclerotic heart disease of native coronary artery without angina pectoris: Secondary | ICD-10-CM

## 2024-09-20 MED ORDER — REPATHA SURECLICK 140 MG/ML ~~LOC~~ SOAJ
1.0000 mL | SUBCUTANEOUS | 1 refills | Status: DC
  Filled 2024-09-20: qty 2, 28d supply, fill #0
  Filled 2024-09-30: qty 6, 84d supply, fill #0

## 2024-09-20 NOTE — Telephone Encounter (Signed)
   Pharmacy Patient Advocate Encounter   Received notification from CoverMyMeds that prior authorization for repatha  is required/requested.   Insurance verification completed.   The patient is insured through [facility] ADVANTAGE/RX ADVANCE .   Per pa

## 2024-09-20 NOTE — Telephone Encounter (Signed)
 RX routed to PharmD

## 2024-09-20 NOTE — Telephone Encounter (Signed)
 Caller stated they will need the following information regarding patient authorization for Repatha . Request ID# [id number]  - Base LDL - Most recent LDL - Patient receiving statin therapy or not - Diagnosis  Caller noted she has also sent a fax regarding this.

## 2024-09-20 NOTE — Telephone Encounter (Signed)
*  STAT* If patient is at the pharmacy, call can be transferred to refill team.   1. Which medications need to be refilled? (please list name of each medication and dose if known)   Evolocumab  (REPATHA  SURECLICK) 140 MG/ML SOAJ     2. Would you like to learn more about the convenience, safety, & potential cost savings by using the [facility]?no   3. Are you open to using the [facility] ([facility] no   4. Which pharmacy/location (including street and city if local pharmacy) is medication to be sent to? [facility] [id number] - [place], [place] - [address]    5. Do they need a 30 day or 90 day supply? 30 day

## 2024-09-21 NOTE — Telephone Encounter (Signed)
 PA request has been Submitted per separate encounter 09/20/24

## 2024-09-22 NOTE — Telephone Encounter (Signed)
 Patient was informed that labs are required prior to her prior authorization. She verbalized understanding and stated she will come to our office to have the labs completed  Orders placed

## 2024-09-22 NOTE — Telephone Encounter (Signed)
 Insurance is waiting labs done in the last 120 days. The last labs she had was from 04/13/24 and her ldl was actually higher than when she started repatha  in jan. Can she get updated labs? Thank you

## 2024-09-22 NOTE — Telephone Encounter (Signed)
 Insurance is waiting labs done in the last 120 days. The last labs she had was from 04/13/24 and her ldl was actually higher than when she started repatha  in jan. Message sent in other encounter

## 2024-09-23 DIAGNOSIS — Z79899 Other long term (current) drug therapy: Secondary | ICD-10-CM | POA: Diagnosis not present

## 2024-09-24 ENCOUNTER — Ambulatory Visit: Payer: Self-pay | Admitting: Student

## 2024-09-24 LAB — LIPID PANEL
Chol/HDL Ratio: 2.6 ratio (ref 0.0–4.4)
Cholesterol, Total: 178 mg/dL (ref 100–199)
HDL: 69 mg/dL (ref >39)
LDL Chol Calc (NIH): 90 mg/dL (ref 0–99)
Triglycerides: 104 mg/dL (ref 0–149)
VLDL Cholesterol Cal: 19 mg/dL (ref 5–40)

## 2024-09-24 NOTE — Progress Notes (Signed)
 Called and spoke with the patient.  Patient is thankful that LDL has decreased.  Asked patient about Repatha  and Zetia .  Patient states that Repatha  was denied by her insurance.  Informed patient that we will get PharmD, Pre-Auth, and Prescription Assistance Team to see what we can do to help her get her medication covered by her insurance.  Patient expressed gratitude. All questions and concerns addressed at this time.

## 2024-09-24 NOTE — Telephone Encounter (Signed)
 Pharmacy Patient Advocate Encounter   Received notification from Pt Calls Messages that prior authorization for REPATHA  is required/requested.   Insurance verification completed.   The patient is insured through [facility] ADVANTAGE/RX ADVANCE.   Per test claim: PA required; PA submitted to above mentioned insurance via Fax Key/confirmation #/EOC X Status is pending

## 2024-09-27 ENCOUNTER — Other Ambulatory Visit (HOSPITAL_COMMUNITY): Payer: Self-pay

## 2024-09-28 ENCOUNTER — Other Ambulatory Visit (HOSPITAL_COMMUNITY): Payer: Self-pay

## 2024-09-29 ENCOUNTER — Other Ambulatory Visit (HOSPITAL_COMMUNITY): Payer: Self-pay

## 2024-09-29 NOTE — Telephone Encounter (Signed)
 I called and they said should know 10/01/24   Appeal case number [id number]

## 2024-09-30 ENCOUNTER — Other Ambulatory Visit: Payer: Self-pay

## 2024-09-30 ENCOUNTER — Other Ambulatory Visit (HOSPITAL_COMMUNITY): Payer: Self-pay

## 2024-09-30 NOTE — Telephone Encounter (Signed)
 Pharmacy Patient Advocate Encounter  Received notification from [employer]/RX ADVANCE that Prior Authorization for repatha  has been APPROVED from 09/29/24 to 09/24/25   I asked the pharmacy to fill it  -0.00 copay

## 2024-10-22 DIAGNOSIS — E782 Mixed hyperlipidemia: Secondary | ICD-10-CM | POA: Diagnosis not present

## 2024-10-22 DIAGNOSIS — I214 Non-ST elevation (NSTEMI) myocardial infarction: Secondary | ICD-10-CM | POA: Diagnosis not present

## 2024-10-22 DIAGNOSIS — I10 Essential (primary) hypertension: Secondary | ICD-10-CM | POA: Diagnosis not present

## 2024-10-22 DIAGNOSIS — Z23 Encounter for immunization: Secondary | ICD-10-CM | POA: Diagnosis not present

## 2024-10-22 DIAGNOSIS — R7303 Prediabetes: Secondary | ICD-10-CM | POA: Diagnosis not present

## 2024-11-09 NOTE — Progress Notes (Signed)
 Cardiology Clinic Note   Date: 11/15/2024 ID: Roshanda, Balazs Aug 31, 1951, MRN [id number]  Primary Cardiologist:  Evalene Lunger, MD  Chief Complaint   Sheila Marquez is a 73 y.o. female who presents to the clinic today for routine follow up.   Patient Profile   Sheila Marquez is followed by Dr. Gollan for the history outlined below.      Past medical history significant for: CAD. LHC 12/31/2023 (NSTEMI): Severe single-vessel CAD with occlusion of mid LAD.  Minimal luminal irregularities are noted in dominant LCx.  Moderate to severely reduced LV systolic function (LVEF 30 to 64%) with mid/apical anterior and apical inferior hypokinesis/akinesis.  Normal LVEDP.  Successful IVUS guided PCI to mid LAD with DES 2.25 x 30 mm.  PCI complicated by wire perforation of small branch (most likely tiny diagonal).  Perforation controlled with balloon tamponade in the proximal LAD.  Small pericardial effusion noted at the time of perforation patient remained hemodynamically stable.  Small and tortuous right radial artery not suitable for catheterization.  Procedure performed via right ulnar artery. Chronic HFmrEF/ischemic cardiomyopathy/pericardial effusion. Echo 01/01/2024: EF 45 to 50%.  Moderate asymmetric left ventricle hypertrophy of the basal-septal segment.  Severe akinesis of the left ventricular, mid-- apical anteroseptal wall, anterior wall and apical segment.  Grade I DD.  Normal RV size/function.  Small pericardial effusion localized near the right ventricle with no evidence of cardiac tamponade.  Trivial MR.  Aortic valve sclerosis without stenosis. Limited echo 01/13/2024: EF 45 to 50%.  Moderate asymmetric left ventricular hypertrophy of the basal-septal segment.  Normal RV size/function.  Trivial pericardial effusion with no evidence of cardiac tamponade.  Mild MR. Hypertension. Hyperlipidemia. LPa 01/01/2024: <8.4. Lipid panel 09/23/2024: LDL 98, HDL 69, TG 104, total  78. Syncope. 14-day ZIO 03/23/2024: HR 41 to 121 bpm, average 66 bpm rare ectopy.  In summary, patient was admitted to [facility] on 12/31/2023 with an NSTEMI after developing symptoms of angina the night prior.  EKG showed sinus tachycardia with 1 mm of ST elevation isolated in V2 with septal T wave inversions and anterior septal Q waves.  Troponin peaked at 9158.  LHC showed severe single-vessel CAD with occlusion of the mid LAD as detailed above.  She underwent PCI with DES to mid LAD complicated by wire perforation of small branch (likely tiny diagonal) controlled with balloon tamponade in the proximal LAD.  A small pericardial effusion was noted at the time of perforation.  Serial echocardiograms performed at bedside demonstrated stable small pericardial effusion.  She remained hemodynamically stable throughout the procedure.  She was transferred to [facility] for further observation and treatment.  Repeat echo the following day showed EF 45 to 50% as detailed above.  Post cath course further complicated by right arm hematoma with good perfusion.  Patient was seen in the office by Sheila Bring, PA-C on 01/13/2024 for hospital follow-up.  Patient reported she was diagnosed with shingles on 01/02/2024 and was not prescribed antiviral therapy.  She experienced fatigue and diminished appetite and was diagnosed with flu on 01/11/2024.  She denied symptoms of angina.  Right radial hematoma was improving.  At the end of patient's follow-up visit she began to feel flushed and had a syncopal episode lasting 1 to 2 minutes.  She had been anxious about having labs drawn and kept saying she felt hot.  Initial BP 107/71, HR 52 bpm, SpO2 97%, blood glucose 137.  Repeat EKG demonstrated sinus bradycardia 52 bpm, inferior ST  elevation with stable anterior T wave inversion with new T wave inversion along V6.  Hypotension and bradycardia resolved spontaneously while in the office.  She was given 324 mg aspirin .  Repeat EKG showed NSR 71  bpm and normalization of inferior ST segments with persistent anterior T wave inversion.  She was transported by EMS to the ED where she remained hemodynamically stable.  High-sensitivity troponin 46 with delta troponin of 54 subsequently downtrending to 46.  Renal function mildly elevated at 1.08 with baseline around 0.7.  CBC unremarkable.  Chest x-ray without evidence of acute cardiopulmonary process.  While in the emergency room she was evaluated by interventional cardiology and reported feeling back to baseline.  She reported similar episodes of feeling hot and getting lightheaded in the remote past as well as anxiety about blood draws.  Transient inferior ST elevation felt to be secondary to supply-demand mismatch and global ischemia from bradycardia/hypotension.  Minimal troponin elevation relatively flat and may reflect supply-demand mismatch from today's event or residual troponin from recent MI.  Patient was discharged from the ED the same day. Upon follow up at the end of February, patient denied any further syncopal episodes and was fully recovered from shingles and flu.  She had not received ZIO monitor in the mail. She questioned if she needed to complete the monitor and importance of full workup for syncope discussed. She agreed to proceed with the monitor.    Patient was last seen in the office by me on 05/18/2024 for routine follow up. She attending cardiac rehab with good tolerance and planned to join [employer] [facility] [employer] after graduating from rehab. Losartan  had recently been increased and BP was well controlled.      History of Present Illness    Today, patient is doing well. Patient denies shortness of breath, dyspnea on exertion, lower extremity edema, orthopnea or PND. No chest pain, pressure, or tightness. No palpitations. She is not experiencing lightheadedness, dizziness, presyncope or syncope. She is very active using the elliptical 3 days a week and walking 2 days a week.  She has chronic pain in her knees from arthritis and wonders if she can take turmeric. She is also pending dental implant and was told by her dentist that she will need to hold Brilinta . She is pushing it off until January.      ROS: All other systems reviewed and are otherwise negative except as noted in History of Present Illness.  EKGs/Labs Reviewed    EKG Interpretation Date/Time:  Monday November 15 2024 09:10:03 EST Ventricular Rate:  68 PR Interval:  138 QRS Duration:  76 QT Interval:  388 QTC Calculation: 412 R Axis:   47  Text Interpretation: Normal sinus rhythm Low voltage QRS Nonspecific T wave abnormality Septal infarct (cited on or before 31-Dec-2023) When compared with ECG of 16-Feb-2024 10:25, No significant change was found Confirmed by Sheila Marquez [phone]) on 11/15/2024 9:16:01 AM   01/13/2024: ALT 36; AST 43; BUN 18; Creatinine, Ser 1.08; Potassium 3.5; Sodium 137   01/13/2024: Hemoglobin 13.8; WBC 8.1   12/31/2023: TSH 0.963    Physical Exam    VS:  BP 120/65 (BP Location: Left Arm, Patient Position: Sitting, Cuff Size: Normal)   Pulse 68 Comment: 79 oximeter  Ht 5' 2 (1.575 m)   Wt 147 lb 3.2 oz (66.8 kg)   LMP  (LMP Unknown)   SpO2 98%   BMI 26.92 kg/m  , BMI Body mass index is 26.92 kg/m.  GEN: Well nourished, well developed, in no acute distress. Neck: No JVD or carotid bruits. Cardiac:  RRR.  No murmur. No rubs or gallops.   Respiratory:  Respirations regular and unlabored. Clear to auscultation without rales, wheezing or rhonchi. GI: Soft, nontender, nondistended. Extremities: Radials/DP/PT 2+ and equal bilaterally. No clubbing or cyanosis. No edema   Skin: Warm and dry, no rash. Neuro: Strength intact.  Assessment & Plan   CAD S/p PCI with DES to proximal LAD in the setting of NSTEMI January 2025 complicated by wire perforation of (likely) tiny diagonal and development of small pericardial effusion.  Patient denies chest pain, pressure or  tightness. She is very active using the elliptical 3 days a week and walking 2 days a week. She asks about taking turmeric for knee arthritis and is advised she should avoid it as it can increase her bleeding risk.  - Per Dr. Gollan, will change to Brilinta  60 mg bid at one year out from PCI. Will send in Rx at the end of December.  - Continue aspirin , Brilinta , Toprol , Zetia .   Chronic HFmrEF/ischemic cardiomyopathy Echo 01/01/2024 showed EF 45 to 50%, wall motion abnormalities as described above in narrative history, Grade I DD, small pericardial effusion localized near the right ventricle without cardiac tamponade.  Repeat limited echo 01/13/2024 demonstrated EF 45 to 50%, moderate asymmetric LVH of basal-septal segment, trivial pericardial effusion with no evidence of cardiac tamponade.  Patient denies shortness of breath, DOE, lower extremity edema, orthopnea or PND.  Euvolemic and well compensated on exam. - Continue losartan , Toprol , spironolactone . - Repeat echo as clinically indicated.    Hypertension BP today 120/65. No report of headache or dizziness.  -Continue losartan , Toprol , spironolactone .   Hyperlipidemia/myalgias from statins LPa January 2025 <8.4.  LDL 98 October 2025, not at goal. -Continue Repatha  and Zetia .  Preoperative cardiovascular risk assessment Patient is pending dental implant. She was told by dentist that she will need to stop Brilinta  and aspirin . Discussed with Dr. Gollan who recommends holding off until at least January if not longer for procedure. Ideally, he would like her to stay on DAPT throughout peri-operative period but if dentist feels she is a high bleeding risk she can stop Brilinta  for 5 days prior. He would like her to stay on aspirin  81 mg daily unless bleeding risk too high.  - Instructed patient to have dentist send in request to our pre-op team.   Disposition: Plan to decrease Brilinta  to 60 mg bid after 12/30/2024. Will send in Rx at the end of  December (patient is aware of change). Return in 6 months or sooner as needed.          Signed, Sheila Marquez. Akil Hoos, DNP, NP-C

## 2024-11-15 ENCOUNTER — Encounter: Payer: Self-pay | Admitting: Student

## 2024-11-15 ENCOUNTER — Ambulatory Visit: Attending: Student | Admitting: Student

## 2024-11-15 VITALS — BP 120/65 | HR 68 | Ht 62.0 in | Wt 147.2 lb

## 2024-11-15 DIAGNOSIS — I2510 Atherosclerotic heart disease of native coronary artery without angina pectoris: Secondary | ICD-10-CM | POA: Diagnosis not present

## 2024-11-15 DIAGNOSIS — Z01810 Encounter for preprocedural cardiovascular examination: Secondary | ICD-10-CM | POA: Diagnosis not present

## 2024-11-15 DIAGNOSIS — T466X5D Adverse effect of antihyperlipidemic and antiarteriosclerotic drugs, subsequent encounter: Secondary | ICD-10-CM

## 2024-11-15 DIAGNOSIS — I5022 Chronic systolic (congestive) heart failure: Secondary | ICD-10-CM

## 2024-11-15 DIAGNOSIS — I255 Ischemic cardiomyopathy: Secondary | ICD-10-CM | POA: Diagnosis not present

## 2024-11-15 DIAGNOSIS — I10 Essential (primary) hypertension: Secondary | ICD-10-CM

## 2024-11-15 DIAGNOSIS — G720 Drug-induced myopathy: Secondary | ICD-10-CM | POA: Diagnosis not present

## 2024-11-15 DIAGNOSIS — E785 Hyperlipidemia, unspecified: Secondary | ICD-10-CM

## 2024-11-15 MED ORDER — METOPROLOL SUCCINATE ER 25 MG PO TB24: 12.5000 mg | ORAL_TABLET | Freq: Every day | ORAL | 3 refills | Status: AC

## 2024-11-15 MED ORDER — TICAGRELOR 90 MG PO TABS: 90.0000 mg | ORAL_TABLET | Freq: Two times a day (BID) | ORAL | 3 refills | Status: DC

## 2024-11-15 MED ORDER — LOSARTAN POTASSIUM 25 MG PO TABS: 25.0000 mg | ORAL_TABLET | Freq: Every day | ORAL | 3 refills | Status: AC

## 2024-11-15 MED ORDER — SPIRONOLACTONE 25 MG PO TABS: 12.5000 mg | ORAL_TABLET | Freq: Every day | ORAL | 3 refills | Status: AC

## 2024-11-15 MED ORDER — EZETIMIBE 10 MG PO TABS
10.0000 mg | ORAL_TABLET | Freq: Every day | ORAL | 3 refills | Status: AC
Start: 2024-11-15 — End: ?

## 2024-11-15 MED ORDER — REPATHA SURECLICK 140 MG/ML ~~LOC~~ SOAJ
1.0000 mL | SUBCUTANEOUS | 3 refills | Status: AC
  Filled 2024-12-20: qty 6, 84d supply, fill #0

## 2024-11-15 NOTE — Patient Instructions (Signed)
 Medication Instructions:  Your physician recommends that you continue on your current medications as directed. Please refer to the Current Medication list given to you today.   *If you need a refill on your cardiac medications before your next appointment, please call your pharmacy*  Lab Work: None ordered at this time   Follow-Up: At [facility], you and your health needs are our priority.  As part of our continuing mission to provide you with exceptional heart care, our providers are all part of one team.  This team includes your primary Cardiologist (physician) and Advanced Practice Providers or APPs (Physician Assistants and Nurse Practitioners) who all work together to provide you with the care you need, when you need it.  Your next appointment:   6 month(s)  Provider:   You may see Timothy Gollan, MD or Barnie Hila, NP   We recommend signing up for the patient portal called MyChart.  Sign up information is provided on this After Visit Summary.  MyChart is used to connect with patients for Virtual Visits (Telemedicine).  Patients are able to view lab/test results, encounter notes, upcoming appointments, etc.  Non-urgent messages can be sent to your provider as well.   To learn more about what you can do with MyChart, go to [email or web address].

## 2024-12-20 ENCOUNTER — Other Ambulatory Visit: Payer: Self-pay

## 2024-12-20 ENCOUNTER — Other Ambulatory Visit: Payer: Self-pay | Admitting: Emergency Medicine

## 2024-12-20 MED ORDER — TICAGRELOR 60 MG PO TABS: 60.0000 mg | ORAL_TABLET | Freq: Two times a day (BID) | ORAL | 3 refills | Status: AC

## 2024-12-20 NOTE — Progress Notes (Signed)
 Per Barnie Hila, NP, sent in prescription for the patient for Ticagrelor  (Brilinta ) 60 mg BID to [facility].  Called and spoke with the patient to inform her of the change to her medication dosage.  All questions and concerns addressed at this time.   Hila Barnie, NP  Tobie Houston A, RN Good morning,  Will you please send in Brilinta  60 mg bid for this patient.  Thank you!  DW

## 2025-05-19 ENCOUNTER — Ambulatory Visit: Admitting: Student
